# Patient Record
Sex: Female | Born: 1990 | Race: White | Hispanic: No | Marital: Single | Smoking: Never smoker
Health system: Southern US, Community
[De-identification: ages and names within clinical notes are randomized; demographics above are authoritative.]

## PROBLEM LIST (undated history)

## (undated) DIAGNOSIS — D1771 Benign lipomatous neoplasm of kidney: Secondary | ICD-10-CM

## (undated) DIAGNOSIS — N281 Cyst of kidney, acquired: Secondary | ICD-10-CM

## (undated) DIAGNOSIS — K589 Irritable bowel syndrome without diarrhea: Secondary | ICD-10-CM

## (undated) DIAGNOSIS — K859 Acute pancreatitis without necrosis or infection, unspecified: Secondary | ICD-10-CM

## (undated) DIAGNOSIS — F329 Major depressive disorder, single episode, unspecified: Secondary | ICD-10-CM

## (undated) DIAGNOSIS — K219 Gastro-esophageal reflux disease without esophagitis: Secondary | ICD-10-CM

## (undated) DIAGNOSIS — F419 Anxiety disorder, unspecified: Secondary | ICD-10-CM

## (undated) DIAGNOSIS — F32A Depression, unspecified: Secondary | ICD-10-CM

## (undated) DIAGNOSIS — F429 Obsessive-compulsive disorder, unspecified: Secondary | ICD-10-CM

## (undated) DIAGNOSIS — D649 Anemia, unspecified: Secondary | ICD-10-CM

## (undated) HISTORY — PX: CLOSED REDUCTION ELBOW DISLOCATION: SUR215

## (undated) HISTORY — DX: Obsessive-compulsive disorder, unspecified: F42.9

## (undated) HISTORY — PX: WISDOM TOOTH EXTRACTION: SHX21

## (undated) HISTORY — DX: Irritable bowel syndrome without diarrhea: K58.9

## (undated) HISTORY — DX: Cyst of kidney, acquired: N28.1

## (undated) HISTORY — DX: Benign lipomatous neoplasm of kidney: D17.71

## (undated) HISTORY — DX: Acute pancreatitis without necrosis or infection, unspecified: K85.90

## (undated) HISTORY — DX: Anxiety disorder, unspecified: F41.9

## (undated) HISTORY — PX: NASAL SEPTUM SURGERY: SHX37

---

## 2012-01-16 DIAGNOSIS — K859 Acute pancreatitis without necrosis or infection, unspecified: Secondary | ICD-10-CM

## 2012-01-16 HISTORY — DX: Acute pancreatitis without necrosis or infection, unspecified: K85.90

## 2012-02-08 ENCOUNTER — Inpatient Hospital Stay (HOSPITAL_COMMUNITY)
Admission: EM | Admit: 2012-02-08 | Discharge: 2012-02-12 | DRG: 204 | Disposition: A | Discharge status: Home / Self Care | Payer: BC Managed Care – PPO | Attending: Internal Medicine | Admitting: Internal Medicine

## 2012-02-08 ENCOUNTER — Emergency Department (HOSPITAL_COMMUNITY): Payer: BC Managed Care – PPO

## 2012-02-08 DIAGNOSIS — Z79899 Other long term (current) drug therapy: Secondary | ICD-10-CM

## 2012-02-08 DIAGNOSIS — K859 Acute pancreatitis without necrosis or infection, unspecified: Principal | ICD-10-CM | POA: Diagnosis present

## 2012-02-08 DIAGNOSIS — D3000 Benign neoplasm of unspecified kidney: Secondary | ICD-10-CM | POA: Diagnosis present

## 2012-02-08 DIAGNOSIS — F329 Major depressive disorder, single episode, unspecified: Secondary | ICD-10-CM | POA: Diagnosis present

## 2012-02-08 DIAGNOSIS — F3289 Other specified depressive episodes: Secondary | ICD-10-CM | POA: Diagnosis present

## 2012-02-08 DIAGNOSIS — R51 Headache: Secondary | ICD-10-CM | POA: Diagnosis present

## 2012-02-08 DIAGNOSIS — R748 Abnormal levels of other serum enzymes: Secondary | ICD-10-CM | POA: Diagnosis present

## 2012-02-08 DIAGNOSIS — D696 Thrombocytopenia, unspecified: Secondary | ICD-10-CM | POA: Diagnosis present

## 2012-02-08 DIAGNOSIS — K219 Gastro-esophageal reflux disease without esophagitis: Secondary | ICD-10-CM

## 2012-02-08 DIAGNOSIS — Q618 Other cystic kidney diseases: Secondary | ICD-10-CM

## 2012-02-08 DIAGNOSIS — K5900 Constipation, unspecified: Secondary | ICD-10-CM | POA: Diagnosis present

## 2012-02-08 DIAGNOSIS — R112 Nausea with vomiting, unspecified: Secondary | ICD-10-CM | POA: Diagnosis present

## 2012-02-08 DIAGNOSIS — R1013 Epigastric pain: Secondary | ICD-10-CM | POA: Diagnosis present

## 2012-02-08 HISTORY — DX: Major depressive disorder, single episode, unspecified: F32.9

## 2012-02-08 HISTORY — DX: Depression, unspecified: F32.A

## 2012-02-08 LAB — BASIC METABOLIC PANEL
BUN: 15 mg/dL (ref 6–23)
Calcium: 9.1 mg/dL (ref 8.4–10.5)
GFR calc Af Amer: >90 mL/min (ref >90)
GFR calc non Af Amer: >90 mL/min (ref >90)
Potassium: 4.2 mEq/L (ref 3.5–5.1)
Sodium: 137 mEq/L (ref 135–145)

## 2012-02-08 LAB — URINALYSIS, ROUTINE W REFLEX MICROSCOPIC
Bilirubin Urine: NEGATIVE
Hgb urine dipstick: NEGATIVE
Ketones, ur: NEGATIVE mg/dL
Nitrite: NEGATIVE
Specific Gravity, Urine: 1.006 (ref 1.005–1.030)
pH: 7.5 (ref 5.0–8.0)

## 2012-02-08 LAB — POCT I-STAT TROPONIN I: Troponin i, poc: 0 ng/mL (ref 0.00–0.08)

## 2012-02-08 LAB — CBC
MCH: 28 pg (ref 26.0–34.0)
MCHC: 34.2 g/dL (ref 30.0–36.0)
RDW: 12.3 % (ref 11.5–15.5)

## 2012-02-08 MED ORDER — SODIUM CHLORIDE 0.9 % IV SOLN
1000.0000 mL | INTRAVENOUS | Status: DC
  Administered 2012-02-08: 1000 mL via INTRAVENOUS

## 2012-02-08 MED ORDER — SODIUM CHLORIDE 0.9 % IV SOLN
1000.0000 mL | Freq: Once | INTRAVENOUS | Status: AC
  Administered 2012-02-08: 1000 mL via INTRAVENOUS

## 2012-02-08 MED ORDER — ONDANSETRON HCL 4 MG/2ML IJ SOLN
4.0000 mg | Freq: Four times a day (QID) | INTRAMUSCULAR | Status: DC | PRN
  Filled 2012-02-08 (×2): qty 2

## 2012-02-08 NOTE — ED Notes (Signed)
EKG printed and given to [facility] for review. No previous available for comparison

## 2012-02-08 NOTE — ED Notes (Signed)
RUE:[id number]<JW> Expected date:<BR> Expected time:<BR> Means of arrival:<BR> Comments:<BR> Rm 12, PT 32, 21 F, Epigastric Pain

## 2012-02-08 NOTE — ED Notes (Signed)
Per PTAR ,pt.is from home with complaint  Of abdominal pain with nausea and claimed that it hurts so bad that it hurts her chest too.A/O x 3. Denies SOB.

## 2012-02-08 NOTE — ED Provider Notes (Signed)
History   CSN: [id number] Arrival date & time 02/08/12  2036 First MD Initiated Contact with Patient 02/08/12 2102      Chief Complaint  Patient presents with  . Chest Pain  . Abdominal Pain  . Nausea    HPI Comments: The pain was severe in the epigastric area and into the chest.  It radiated to the back.  She had been having some milder issues recently. She thought she was having some trouble with acid reflux. Symptoms have all completely resolved at this point she feels fine   Patient is a 21 y.o. female presenting with abdominal pain. The history is provided by the patient.  Abdominal Pain The primary symptoms of the illness include abdominal pain, nausea and vomiting. The primary symptoms of the illness do not include fever or fatigue. Episode onset: The symptoms started 90 minutes ago. The onset of the illness was sudden (The patient had eaten at a cookout and had a corn dog and milk shake prior to the episode).  The pain came on suddenly. The abdominal pain is located in the epigastric region. The abdominal pain radiates to the chest. The abdominal pain is relieved by nothing.    No past medical history on file.  No past surgical history on file.  No family history on file.  History  Substance Use Topics  . Smoking status: Not on file  . Smokeless tobacco: Not on file  . Alcohol Use: Not on file    OB History    No data available      Review of Systems  Constitutional: Negative for fever and fatigue.  Respiratory: Negative for cough.   Cardiovascular: Positive for chest pain.  Gastrointestinal: Positive for nausea, vomiting and abdominal pain.  All other systems reviewed and are negative.    Allergies  Penicillins  Home Medications   Current Outpatient Rx  Name Route Sig Dispense Refill  . CALCIUM CARBONATE ANTACID 500 MG PO CHEW Oral Chew 2 tablets by mouth daily as needed. heatburn    . FLUOXETINE HCL 90 MG PO CPDR Oral Take 90 mg by mouth every 7  (seven) days.    . CENTRUM PO Oral Take 1 tablet by mouth daily.    Marland Kitchen OMEPRAZOLE 20 MG PO CPDR Oral Take 40 mg by mouth daily.    . PSYLLIUM 95 % PO PACK Oral Take 1 packet by mouth daily.      BP 118/87  Pulse 105  Temp 97.9 F (36.6 C) (Oral)  Resp 15  SpO2 100%  Physical Exam  Nursing note and vitals reviewed. Constitutional: She appears well-developed and well-nourished. No distress.  HENT:  Head: Normocephalic and atraumatic.  Right Ear: External ear normal.  Left Ear: External ear normal.  Eyes: Conjunctivae are normal. Right eye exhibits no discharge. Left eye exhibits no discharge. No scleral icterus.  Neck: Neck supple. No tracheal deviation present.  Cardiovascular: Normal rate, regular rhythm and intact distal pulses.   Pulmonary/Chest: Effort normal and breath sounds normal. No stridor. No respiratory distress. She has no wheezes. She has no rales.  Abdominal: Soft. Bowel sounds are normal. She exhibits no distension. There is tenderness in the right upper quadrant and epigastric area. There is no rebound and no guarding.       Mild ttp  Musculoskeletal: She exhibits no edema and no tenderness.  Neurological: She is alert. She has normal strength. No sensory deficit. Cranial nerve deficit:  no gross defecits noted. She exhibits normal muscle  tone. She displays no seizure activity. Coordination normal.  Skin: Skin is warm and dry. No rash noted.  Psychiatric: She has a normal mood and affect.    ED Course  Procedures (including critical care time)  Rate: 87  Rhythm: normal sinus rhythm  QRS Axis: normal  Intervals: normal  ST/T Wave abnormalities: normal  Conduction Disutrbances:none  Narrative Interpretation:   Old EKG Reviewed: none available  Medications  FLUoxetine (PROZAC WEEKLY) 90 MG DR capsule (not administered)  Multiple Vitamins-Minerals (CENTRUM PO) (not administered)  omeprazole (PRILOSEC) 20 MG capsule (not administered)  calcium carbonate (TUMS  - DOSED IN MG ELEMENTAL CALCIUM) 500 MG chewable tablet (not administered)  psyllium (HYDROCIL/METAMUCIL) 95 % PACK (not administered)  ondansetron (ZOFRAN) injection 4 mg (not administered)  0.9 %  sodium chloride infusion (1000 mL Intravenous New Bag/Given 02/08/12 2200)    Followed by  0.9 %  sodium chloride infusion (1000 mL Intravenous New Bag/Given 02/08/12 2322)    Labs Reviewed  CBC - Abnormal; Notable for the following:    Platelets 133 (*)     All other components within normal limits  BASIC METABOLIC PANEL - Abnormal; Notable for the following:    Glucose, Bld 104 (*)     All other components within normal limits  LIPASE, BLOOD - Abnormal; Notable for the following:    Lipase >3000 (*)  REPEATED TO VERIFY   All other components within normal limits  POCT I-STAT TROPONIN I  URINALYSIS, ROUTINE W REFLEX MICROSCOPIC  PREGNANCY, URINE  HEPATIC FUNCTION PANEL   US Abdomen Complete  02/08/2012  *RADIOLOGY REPORT*  Clinical Data:  Epigastric abdominal pain  COMPLETE ABDOMINAL ULTRASOUND  Comparison:  None.  Findings:  Gallbladder:  No gallstones, gallbladder wall thickening, or pericholecystic fluid.  Common bile duct:  Measures 3 mm, within normal limits.  Liver:  Echogenicity within normal limits.  No focal abnormality.  IVC:  Appears normal.  Pancreas:  No focal abnormality identified within the head/neck. The body/tail are obscured by overlying bowel gas artifact.  Spleen:  Measures 7 cm, within normal limits.  Right Kidney:  Measures 11.4 cm.  There is a subcentimeter hypoechoic/nearly anechoic focus along the interpolar right kidney, favored to reflect a cyst by incomplete characterize.  Left Kidney:  Measures 11 cm. Mild pelvicaliectasis.  8 mm hyperechoic focus lower pole.  Abdominal aorta:  No aneurysm identified.  IMPRESSION: Mild pelvicaliectasis on the left.  Correlate clinically if concerned for distal obstruction or stone.  Subcentimeter renal lesions, hypoechoic on the right  and hyperechoic on the left, incompletely characterized.  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  Can be further characterized with a renal MRI follow-up.   Original Report Authenticated By: Waneta Martins, M.D.      1. Pancreatitis       MDM  Pt admit to daily alcohol use but states she only has one beverage per night.  No sign of gallstones on ultrasound.  Pt is feeling better now but her pain was very severe.  Will admit for pain management, bowel rest.        Celene Kras, MD 02/09/12 [id number]

## 2012-02-09 ENCOUNTER — Encounter (HOSPITAL_COMMUNITY): Payer: Self-pay | Admitting: Internal Medicine

## 2012-02-09 DIAGNOSIS — D696 Thrombocytopenia, unspecified: Secondary | ICD-10-CM

## 2012-02-09 DIAGNOSIS — R112 Nausea with vomiting, unspecified: Secondary | ICD-10-CM | POA: Diagnosis present

## 2012-02-09 DIAGNOSIS — R1013 Epigastric pain: Secondary | ICD-10-CM

## 2012-02-09 DIAGNOSIS — K859 Acute pancreatitis without necrosis or infection, unspecified: Principal | ICD-10-CM | POA: Diagnosis present

## 2012-02-09 LAB — BASIC METABOLIC PANEL
BUN: 7 mg/dL (ref 6–23)
Chloride: 104 mEq/L (ref 96–112)
Creatinine, Ser: 0.45 mg/dL — ABNORMAL LOW (ref 0.50–1.10)
GFR calc Af Amer: >90 mL/min (ref >90)
GFR calc non Af Amer: >90 mL/min (ref >90)

## 2012-02-09 LAB — HEPATIC FUNCTION PANEL
ALT: 13 U/L (ref 0–35)
Albumin: 3.9 g/dL (ref 3.5–5.2)
Alkaline Phosphatase: 48 U/L (ref 39–117)
Total Bilirubin: 0.1 mg/dL — ABNORMAL LOW (ref 0.3–1.2)
Total Protein: 7.1 g/dL (ref 6.0–8.3)

## 2012-02-09 LAB — CBC
HCT: 34.7 % — ABNORMAL LOW (ref 36.0–46.0)
MCHC: 35.2 g/dL (ref 30.0–36.0)
RDW: 12.4 % (ref 11.5–15.5)

## 2012-02-09 MED ORDER — ACETAMINOPHEN 650 MG RE SUPP: 650.0000 mg | Freq: Four times a day (QID) | RECTAL | Status: DC | PRN

## 2012-02-09 MED ORDER — DEXTROSE-NACL 5-0.45 % IV SOLN
INTRAVENOUS | Status: AC
  Administered 2012-02-09: 04:00:00 via INTRAVENOUS

## 2012-02-09 MED ORDER — ENOXAPARIN SODIUM 40 MG/0.4ML ~~LOC~~ SOLN
40.0000 mg | SUBCUTANEOUS | Status: DC
  Administered 2012-02-09 – 2012-02-12 (×4): 40 mg via SUBCUTANEOUS
  Filled 2012-02-09 (×5): qty 0.4

## 2012-02-09 MED ORDER — ONDANSETRON HCL 4 MG PO TABS: 4.0000 mg | ORAL_TABLET | Freq: Four times a day (QID) | ORAL | Status: DC | PRN

## 2012-02-09 MED ORDER — PANTOPRAZOLE SODIUM 40 MG IV SOLR
40.0000 mg | Freq: Two times a day (BID) | INTRAVENOUS | Status: DC
  Administered 2012-02-09 – 2012-02-12 (×8): 40 mg via INTRAVENOUS
  Filled 2012-02-09 (×9): qty 40

## 2012-02-09 MED ORDER — ZOLPIDEM TARTRATE 5 MG PO TABS: 5.0000 mg | ORAL_TABLET | Freq: Every evening | ORAL | Status: DC | PRN

## 2012-02-09 MED ORDER — SODIUM CHLORIDE 0.9 % IV SOLN
INTRAVENOUS | Status: DC
  Administered 2012-02-09: 20:00:00 via INTRAVENOUS
  Administered 2012-02-09: 125 mL/h via INTRAVENOUS
  Administered 2012-02-10: 04:00:00 via INTRAVENOUS

## 2012-02-09 MED ORDER — FLUOXETINE HCL 90 MG PO CPDR
90.0000 mg | DELAYED_RELEASE_CAPSULE | ORAL | Status: DC
  Administered 2012-02-09: 90 mg via ORAL
  Filled 2012-02-09: qty 1

## 2012-02-09 MED ORDER — ONDANSETRON HCL 4 MG/2ML IJ SOLN
4.0000 mg | Freq: Four times a day (QID) | INTRAMUSCULAR | Status: DC | PRN
  Administered 2012-02-09 – 2012-02-10 (×2): 4 mg via INTRAVENOUS

## 2012-02-09 MED ORDER — ONDANSETRON HCL 4 MG/2ML IJ SOLN: 4.0000 mg | Freq: Three times a day (TID) | INTRAMUSCULAR | Status: AC | PRN

## 2012-02-09 MED ORDER — OXYCODONE HCL 5 MG PO TABS
5.0000 mg | ORAL_TABLET | ORAL | Status: DC | PRN
  Administered 2012-02-09 – 2012-02-10 (×2): 5 mg via ORAL
  Filled 2012-02-09 (×2): qty 1

## 2012-02-09 MED ORDER — ACETAMINOPHEN 325 MG PO TABS: 650.0000 mg | ORAL_TABLET | Freq: Four times a day (QID) | ORAL | Status: DC | PRN

## 2012-02-09 MED ORDER — HYDROMORPHONE HCL PF 1 MG/ML IJ SOLN
0.5000 mg | INTRAMUSCULAR | Status: DC | PRN
  Administered 2012-02-09 – 2012-02-10 (×2): 1 mg via INTRAVENOUS
  Filled 2012-02-09 (×2): qty 1

## 2012-02-09 MED ORDER — HYDROMORPHONE HCL PF 1 MG/ML IJ SOLN: 1.0000 mg | INTRAMUSCULAR | Status: AC | PRN

## 2012-02-09 MED ORDER — ALUM & MAG HYDROXIDE-SIMETH 200-200-20 MG/5ML PO SUSP: 30.0000 mL | Freq: Four times a day (QID) | ORAL | Status: DC | PRN

## 2012-02-09 NOTE — H&P (Signed)
[facility] History and Physical  Darrelle Barrell EAV:[id number] DOB: 01-Sep-1990 DOA: 02/08/2012  Referring physician: EDP PCP: No primary provider on file.   Chief Complaint: Severe Epigastric Pain  HPI: Katherine Manning is a 21 y.o. female presents to the ED with complaints of severe epigastric ABD Pain which has been occuring intermittently for the past year, but worsened today over the past 12 hours.  She reports that the pain became so severe that it caused her to double over.  She describes the pain as being sharp and was continuous today for 20 minutes and was a 10/10.  She had nausea and vomiting X 1 today as well.  She denies hemoptysis, and diarrhea, and also denies fevers and chills.  She does report feeling increased indigestion symptoms.    Review of Systems: The patient denies anorexia, fever, weight loss, vision loss, decreased hearing, hoarseness, syncope, dyspnea on exertion, peripheral edema, balance deficits, hemoptysis, melena, hematochezia, hematuria, incontinence, genital sores, muscle weakness, suspicious skin lesions, transient blindness, difficulty walking, unusual weight change, abnormal bleeding, enlarged lymph nodes, angioedema, and breast masses.    Past Medical History:  Depression  Past Surgical History:  Nasal Septoplasty, Right Elbow Surgery Due to Dislocation, Wisdom Teeth.     Prior to Admission medications   Medication Sig Start Date End Date Taking? Authorizing Provider  calcium carbonate (TUMS - DOSED IN MG ELEMENTAL CALCIUM) 500 MG chewable tablet Chew 2 tablets by mouth daily as needed. heatburn   Yes Historical Provider, MD  FLUoxetine (PROZAC WEEKLY) 90 MG DR capsule Take 90 mg by mouth every 7 (seven) days.   Yes Historical Provider, MD  Multiple Vitamins-Minerals (CENTRUM PO) Take 1 tablet by mouth daily.   Yes Historical Provider, MD  omeprazole (PRILOSEC) 20 MG capsule Take 40 mg by mouth daily.   Yes Historical Provider, MD  psyllium  (HYDROCIL/METAMUCIL) 95 % PACK Take 1 packet by mouth daily.   Yes Historical Provider, MD    Allergies  Allergen Reactions  . Penicillins Hives     Social History:   Second Engineer, maintenance (IT).    does not have a smoking history on file. She does not have any smokeless tobacco history on file. Her alcohol and drug histories not on file.  She reports drinking 1 drink daily (Beer or wine) for a few years.     Physical Exam:  GEN:  Pleasant Thin well developed Caucasian female examined  and in  Discomfort but no acute distress; cooperative with exam Filed Vitals:   02/08/12 2034 02/08/12 2037  BP:  118/87  Pulse:  105  Temp:  97.9 F (36.6 C)  TempSrc:  Oral  Resp:  15  SpO2: 98% 100%    Blood pressure 118/87, pulse 105, temperature 97.9 F (36.6 C), temperature source Oral, resp. rate 15, SpO2 100.00%. PSYCH: She is alert and oriented x4; does not appear anxious does not appear depressed; affect is normal HEENT: Normocephalic and Atraumatic, Mucous membranes pink; PERRLA; EOM intact; Fundi:  Benign;  No scleral icterus, Nares: Patent, Oropharynx: Clear, Fair Dentition, Neck:  FROM, no cervical lymphadenopathy nor thyromegaly or carotid bruit; no JVD; Breasts:: Not examined CHEST WALL: No tenderness CHEST: Normal respiration, clear to auscultation bilaterally HEART: Regular rate and rhythm; no murmurs rubs or gallops BACK: No kyphosis or scoliosis; no CVA tenderness ABDOMEN: Positive Bowel Sounds, Scaphoid, soft tender in the Epigastrium No Rebound No Guarding; no masses, no organomegaly.   Rectal Exam: Not done EXTREMITIES: No bone or joint  deformity; age-appropriate arthropathy of the hands and knees; no cyanosis, clubbing or edema; no ulcerations. Genitalia: not examined PULSES: 2+ and symmetric SKIN: Normal hydration no rash or ulceration CNS: Cranial nerves 2-12 grossly intact no focal neurologic deficit   Labs on Admission:  Basic Metabolic Panel:  Lab 02/08/12 1610   NA 137  K 4.2  CL 102  CO2 25  GLUCOSE 104*  BUN 15  CREATININE 0.50  CALCIUM 9.1  MG --  PHOS --   Liver Function Tests:  Lab 02/08/12 2125  AST 18  ALT 13  ALKPHOS 48  BILITOT 0.1*  PROT 7.1  ALBUMIN 3.9    Lab 02/08/12 2125  LIPASE >3000*  AMYLASE --   No results found for this basename: AMMONIA:5 in the last 168 hours CBC:  Lab 02/08/12 2125  WBC 10.3  NEUTROABS --  HGB 13.6  HCT 39.8  MCV 81.9  PLT 133*   Cardiac Enzymes: No results found for this basename: CKTOTAL:5,CKMB:5,CKMBINDEX:5,TROPONINI:5 in the last 168 hours  BNP (last 3 results) No results found for this basename: PROBNP:3 in the last 8760 hours CBG: No results found for this basename: GLUCAP:5 in the last 168 hours  Radiological Exams on Admission: US Abdomen Complete  02/08/2012  *RADIOLOGY REPORT*  Clinical Data:  Epigastric abdominal pain  COMPLETE ABDOMINAL ULTRASOUND  Comparison:  None.  Findings:  Gallbladder:  No gallstones, gallbladder wall thickening, or pericholecystic fluid.  Common bile duct:  Measures 3 mm, within normal limits.  Liver:  Echogenicity within normal limits.  No focal abnormality.  IVC:  Appears normal.  Pancreas:  No focal abnormality identified within the head/neck. The body/tail are obscured by overlying bowel gas artifact.  Spleen:  Measures 7 cm, within normal limits.  Right Kidney:  Measures 11.4 cm.  There is a subcentimeter hypoechoic/nearly anechoic focus along the interpolar right kidney, favored to reflect a cyst by incomplete characterize.  Left Kidney:  Measures 11 cm. Mild pelvicaliectasis.  8 mm hyperechoic focus lower pole.  Abdominal aorta:  No aneurysm identified.  IMPRESSION: Mild pelvicaliectasis on the left.  Correlate clinically if concerned for distal obstruction or stone.  Subcentimeter renal lesions, hypoechoic on the right and hyperechoic on the left, incompletely characterized.  Favored to reflect a benign mildly complex cyst and an AML  respectively.  Can be further characterized with a renal MRI follow-up.   Original Report Authenticated By: Waneta Martins, M.D.     EKG: Independently reviewed.   Assessment: Principal Problem:  *Acute pancreatitis Active Problems:  Epigastric abdominal pain  Nausea & vomiting  Thrombocytopenia   Plan:    Admit to Med/Surg Bed Supportive Care with IVFs, Clear liquid Diet Bowel Rest.   Anti-Emetics PRN, and Pain Control PRN.   Monitor Lipase levels, and LFTs, and Platelets DVT Prophylaxis Consider GI Consult     Code Status: FULL CODE Family Communication:   Disposition Plan: Return to Home  Time spent: 60 minutes

## 2012-02-09 NOTE — Progress Notes (Signed)
1:45 PM I agree with HPI/GPe and A/P per Dr. Lovell Sheehan   Patient admitted early this morning with severe pain, lipase is over 200, patient's ultrasound was negative. She states she is having reflux like symptoms over the past 2 months and was given a PPI to take by student health.  She states she has no current pain is not been requesting pain medication.  She has been on clear liquids.  She states that the pain was in the pit of her stomach with radiation to the back and was a boring-type pain.  She states she also has some moderate dysphagia and discomfort with being n.p.o. for any periods of time.  This raises the question that this might be #1 gallstone pancreatitis vs. #2 his severe esophagitis.  I think that patient potentially looks much better than her numbers and will need close outpatient gastroenterology followup.  We will keep her on clear liquids, gradually diet probably the morning, get a lipase tomorrow, have patient ambulate, continue IV fluids, and I will discuss with family the rest of her course of care.  I did call her father at telephone #[phone] and updated him as to the course of care      Patient Active Problem List  Diagnosis  . Acute pancreatitis  . Epigastric abdominal pain  . Nausea & vomiting  . Thrombocytopenia   Pleas Koch, MD [facility] [phone]

## 2012-02-10 ENCOUNTER — Inpatient Hospital Stay (HOSPITAL_COMMUNITY): Payer: BC Managed Care – PPO

## 2012-02-10 LAB — COMPREHENSIVE METABOLIC PANEL
AST: 13 U/L (ref 0–37)
BUN: 3 mg/dL — ABNORMAL LOW (ref 6–23)
CO2: 26 mEq/L (ref 19–32)
Calcium: 8.5 mg/dL (ref 8.4–10.5)
Creatinine, Ser: 0.49 mg/dL — ABNORMAL LOW (ref 0.50–1.10)
GFR calc Af Amer: >90 mL/min (ref >90)
GFR calc non Af Amer: >90 mL/min (ref >90)
Glucose, Bld: 88 mg/dL (ref 70–99)

## 2012-02-10 LAB — LIPASE, BLOOD: Lipase: 30 U/L (ref 11–59)

## 2012-02-10 MED ORDER — SODIUM CHLORIDE 0.9 % IV SOLN
1000.0000 mL | INTRAVENOUS | Status: DC
  Administered 2012-02-10 – 2012-02-11 (×2): 1000 mL via INTRAVENOUS

## 2012-02-10 MED ORDER — GADOBENATE DIMEGLUMINE 529 MG/ML IV SOLN
11.0000 mL | Freq: Once | INTRAVENOUS | Status: AC | PRN
  Administered 2012-02-10: 11 mL via INTRAVENOUS

## 2012-02-10 MED ORDER — ACETAMINOPHEN 10 MG/ML IV SOLN
1000.0000 mg | Freq: Four times a day (QID) | INTRAVENOUS | Status: AC
  Administered 2012-02-10 – 2012-02-11 (×4): 1000 mg via INTRAVENOUS
  Filled 2012-02-10 (×4): qty 100

## 2012-02-10 NOTE — Consult Note (Signed)
Referring Provider: Hospitalist Primary Care Physician:  [facility] Primary Gastroenterologist:  None  Reason for Consultation:  Abdominal pain.  Pancreatitis.  HPI: Katherine Manning is a 21 y.o. female who had been experiencing constant epigastric pain for the last couple of months.  Says that it would worsen with eating.  First noticed after she had started going to the [place] bar with some friends; she stopped going, but the pain continued.  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  Had taken some Tums as well.  Saturday she developed sudden onset of worsening pain with nausea and vomiting.  Pain was radiating through to her back and was an 8-9/10.  Last about 20 minutes and by the time EMS came the pain had started to subside.  On admission her lipase was over 3000.  Ultrasound did not show any gallstones, dilated bile ducts, or pancreatic issues (from what could be visualized).  LFT's were WNL's.  Lipase has returned to normal at this point and patient is feeling better.  Still with some soreness in upper abdomen.  Tried eating clears last night and got sick from those.  She says that she drinks about one drink a night; had been at a party on Friday night, but did not drink excessively.  Admits to chronic constipation for which she has tried taking intermittent doses of metamucil and miralax.   Past Medical History  Diagnosis Date  . Depression     Past Surgical History  Procedure Date  . Nasal septum surgery   . Closed reduction elbow dislocation   . Wisdom tooth extraction     Prior to Admission medications   Medication Sig Start Date End Date Taking? Authorizing Provider  calcium carbonate (TUMS - DOSED IN MG ELEMENTAL CALCIUM) 500 MG chewable tablet Chew 2 tablets by mouth daily as needed. heatburn   Yes Historical Provider, MD  FLUoxetine (PROZAC WEEKLY) 90 MG DR capsule Take 90 mg by mouth every 7 (seven) days.   Yes Historical  Provider, MD  Multiple Vitamins-Minerals (CENTRUM PO) Take 1 tablet by mouth daily.   Yes Historical Provider, MD  omeprazole (PRILOSEC) 20 MG capsule Take 40 mg by mouth daily.   Yes Historical Provider, MD  psyllium (HYDROCIL/METAMUCIL) 95 % PACK Take 1 packet by mouth daily.   Yes Historical Provider, MD    Current Facility-Administered Medications  Medication Dose Route Frequency Provider Last Rate Last Dose  . 0.9 %  sodium chloride infusion  1,000 mL Intravenous Continuous Celene Kras, MD 125 mL/hr at 02/08/12 2322 1,000 mL at 02/08/12 2322  . 0.9 %  sodium chloride infusion   Intravenous Continuous Ron Parker, MD 125 mL/hr at 02/10/12 0428    . acetaminophen (TYLENOL) tablet 650 mg  650 mg Oral Q6H PRN Ron Parker, MD       Or  . acetaminophen (TYLENOL) suppository 650 mg  650 mg Rectal Q6H PRN Ron Parker, MD      . alum & mag hydroxide-simeth (MAALOX/MYLANTA) 200-200-20 MG/5ML suspension 30 mL  30 mL Oral Q6H PRN Ron Parker, MD      . dextrose 5 %-0.45 % sodium chloride infusion   Intravenous STAT Celene Kras, MD 100 mL/hr at 02/09/12 0423    . enoxaparin (LOVENOX) injection 40 mg  40 mg Subcutaneous Q24H Ron Parker, MD   40 mg at 02/09/12 1057  . FLUoxetine (PROZAC WEEKLY) DR capsule 90 mg  90  mg Oral Weekly Ron Parker, MD   90 mg at 02/09/12 1720  . HYDROmorphone (DILAUDID) injection 0.5-1 mg  0.5-1 mg Intravenous Q3H PRN Ron Parker, MD   1 mg at 02/10/12 0204  . HYDROmorphone (DILAUDID) injection 1 mg  1 mg Intravenous Q4H PRN Celene Kras, MD      . ondansetron [facility]) injection 4 mg  4 mg Intravenous Q6H PRN Celene Kras, MD      . ondansetron [facility]) injection 4 mg  4 mg Intravenous Q8H PRN Celene Kras, MD      . ondansetron [facility]) tablet 4 mg  4 mg Oral Q6H PRN Ron Parker, MD       Or  . ondansetron (ZOFRAN) injection 4 mg  4 mg Intravenous Q6H PRN Ron Parker, MD   4 mg at 02/10/12 0204  . oxyCODONE (Oxy  IR/ROXICODONE) immediate release tablet 5 mg  5 mg Oral Q4H PRN Ron Parker, MD   5 mg at 02/09/12 1904  . pantoprazole (PROTONIX) injection 40 mg  40 mg Intravenous Q12H Ron Parker, MD   40 mg at 02/09/12 2200  . zolpidem (AMBIEN) tablet 5 mg  5 mg Oral QHS PRN Ron Parker, MD        Allergies as of 02/08/2012 - Review Complete 02/08/2012  Allergen Reaction Noted  . Penicillins Hives 02/08/2012    Family History  Problem Relation Age of Onset  . Coronary artery disease Paternal Grandfather   . Hypertension Maternal Grandmother   . Breast cancer Paternal Grandmother     History   Social History  . Marital Status: Single                 Occupational History  . College student [employer]   Social History Main Topics  . Smoking status: Never Smoker   . Smokeless tobacco: Not on file  . Alcohol Use: 0.0 oz/week  . Drug Use: No  . Sexually Active: Not Currently      Review of Systems: Ten point ROS O/W negative except as mentioned in HPI.  Physical Exam: Vital signs in last 24 hours: Temp:  [97.9 F (36.6 C)-98.8 F (37.1 C)] 97.9 F (36.6 C) (08/26 0140) Pulse Rate:  [78-110] 110  (08/26 0140) Resp:  [16-18] 18  (08/26 0140) BP: (112-127)/(65-69) 119/67 mmHg (08/26 0140) SpO2:  [99 %-100 %] 100 % (08/26 0140) Last BM Date: 02/09/12 General:   Alert,  Well-developed, well-nourished, pleasant and cooperative in NAD Head:  Normocephalic and atraumatic. Eyes:  Sclera clear, no icterus.  Conjunctiva pink. Ears:  Normal auditory acuity. Mouth:  No deformity or lesions.   Lungs:  Clear throughout to auscultation.   No wheezes, crackles, or rhonchi. Heart:  Regular rate and rhythm; no murmurs, clicks, rubs,  or gallops. Abdomen:  Soft, mild diffuse TTP > upper abdomen, BS active, nonpalp mass or hsm.   Msk:  Symmetrical without gross deformities. . Pulses:  Normal pulses noted. Extremities:  Without clubbing or edema. Neurologic:  Alert and  oriented  x4;  grossly normal neurologically. Skin:  Intact without significant lesions or rashes.. Psych:  Alert and cooperative. Normal mood and affect.  Intake/Output from previous day: 08/25 0701 - 08/26 0700 In: 2120 [P.O.:2120] Out: 6150 [Urine:6150]  Lab Results:  Lipase initially >3000, now down to 30  Basename 02/09/12 0517 02/08/12 2125  WBC 7.8 10.3  HGB 12.2 13.6  HCT 34.7* 39.8  PLT 112* 133*  BMET  Basename 02/10/12 0850 02/09/12 0517 02/08/12 2125  NA 135 135 137  K 3.6 3.5 4.2  CL 102 104 102  CO2 26 23 25   GLUCOSE 88 112* 104*  BUN 3* 7 15  CREATININE 0.49* 0.45* 0.50  CALCIUM 8.5 8.2* 9.1   LFT  Basename 02/10/12 0850 02/08/12 2125  PROT 5.8* --  ALBUMIN 3.3* --  AST 13 --  ALT 11 --  ALKPHOS 40 --  BILITOT 0.3 --  BILIDIR -- <0.1  IBILI -- NOT CALCULATED   Studies/Results: US Abdomen Complete  02/08/2012  *RADIOLOGY REPORT*  Clinical Data:  Epigastric abdominal pain  COMPLETE ABDOMINAL ULTRASOUND  Comparison:  None.  Findings:  Gallbladder:  No gallstones, gallbladder wall thickening, or pericholecystic fluid.  Common bile duct:  Measures 3 mm, within normal limits.  Liver:  Echogenicity within normal limits.  No focal abnormality.  IVC:  Appears normal.  Pancreas:  No focal abnormality identified within the head/neck. The body/tail are obscured by overlying bowel gas artifact.  Spleen:  Measures 7 cm, within normal limits.  Right Kidney:  Measures 11.4 cm.  There is a subcentimeter hypoechoic/nearly anechoic focus along the interpolar right kidney, favored to reflect a cyst by incomplete characterize.  Left Kidney:  Measures 11 cm. Mild pelvicaliectasis.  8 mm hyperechoic focus lower pole.  Abdominal aorta:  No aneurysm identified.  IMPRESSION: Mild pelvicaliectasis on the left.  Correlate clinically if concerned for distal obstruction or stone.  Subcentimeter renal lesions, hypoechoic on the right and hyperechoic on the left, incompletely characterized.  Favored  to reflect a benign mildly complex cyst and an AML respectively.  Can be further characterized with a renal MRI follow-up.   Original Report Authenticated By: Waneta Martins, M.D.     IMPRESSION:  1.  Acute pancreatitis-Lipase initially over 3000, but now normalized. 2.  Epigastric pain-intermittent over the last few months, but worsened with this acute episode.  ? Underlying GERD. 3.  Nausea and vomiting-secondary to #1 4.  Thrombocytopenia 5.  Constipation-chronic.  PLAN: 1.   ZEHR, JESSICA D.  02/10/2012, 10:10 AM  Pager number [phone]  [facility] Attending  I have also seen and assessed the patient and agree with the above note.  She is improved and appears to have had acute pancreatitis within 24 hours of drinking 5 shots of alcohol.  There is a background of more chronic daily dyspepsia.  Will get MR/MRCP and then decide if any further work-up needed.  If it is unrevealing anticipate PPI therapy, avoid alcohol and outpatient follow-up.  She could need an EGd but not convinced it is necessary.  Iva Boop, MD, [facility] [phone] (pager) 02/10/2012 1:36 PM

## 2012-02-10 NOTE — Progress Notes (Signed)
PROGRESS NOTE  Katherine Manning ZOX:[id number] DOB: [birth year] DOA: 02/08/2012 PCP: No primary provider on file.  Brief narrative: 21 yr old admitted 8/25 with Abd pain-? Esophagitis/ ?Gall stone pancreatitis-patient has been seen by GI 8/26 who recommends MRCP   Past medical history-As per Problem list Chart reviewed as below- See HPI and notes  Consultants:  Gi-Dr. Leone Payor  Procedures:  US abdomen 02/08/12-Mild pelvicaliectasis on the left. Correlate clinically if concerned for distal obstruction or stone. Subcentimeter renal lesions, hypoechoic on the right and hyperechoic on the left, incompletely characterized. 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 Can be further characterized with a renal MRI follow-up.  Antibiotics:  NAD   Subjective  Feels much better.  Hungry and has HA.  States drinks a lot of caffeine usually and feels like this is withdrawal from the same   Objective    Interim History: Appreciate Dr. Marvell Fuller input  Telemetry: Non-tele  Objective: Filed Vitals:   02/09/12 2035 02/10/12 0140 02/10/12 0935 02/10/12 1416  BP: 127/66 119/67 114/70 108/71  Pulse: 84 110 84 91  Temp: 98.2 F (36.8 C) 97.9 F (36.6 C) 98.2 F (36.8 C) 98.9 F (37.2 C)  TempSrc: Oral Oral Oral Oral  Resp: 18 18 18 18   Height:      Weight:      SpO2: 99% 100% 100% 100%    Intake/Output Summary (Last 24 hours) at 02/10/12 1521 Last data filed at 02/10/12 1416  Gross per 24 hour  Intake    320 ml  Output   4800 ml  Net  -4480 ml    Exam:  General: NAD Cardiovascular: s1 s2 no m/r/g Respiratory: clear Abdomen: mild epig tenderness Skin no rash  Neuro: Anxious.  Data Reviewed: Basic Metabolic Panel:  Lab 02/10/12 8119 02/09/12 0517 02/08/12 2125  NA 135 135 137  K 3.6 3.5 --  CL 102 104 102  CO2 26 23 25   GLUCOSE 88 112* 104*  BUN 3* 7 15  CREATININE 0.49* 0.45* 0.50  CALCIUM 8.5 8.2* 9.1  MG -- -- --  PHOS -- -- --     Liver Function Tests:  Lab 02/10/12 0850 02/08/12 2125  AST 13 18  ALT 11 13  ALKPHOS 40 48  BILITOT 0.3 0.1*  PROT 5.8* 7.1  ALBUMIN 3.3* 3.9    Lab 02/10/12 0850 02/09/12 0500 02/08/12 2125  LIPASE 30 236* >3000*  AMYLASE -- -- --   No results found for this basename: AMMONIA:5 in the last 168 hours CBC:  Lab 02/09/12 0517 02/08/12 2125  WBC 7.8 10.3  NEUTROABS -- --  HGB 12.2 13.6  HCT 34.7* 39.8  MCV 81.1 81.9  PLT 112* 133*   Cardiac Enzymes: No results found for this basename: CKTOTAL:5,CKMB:5,CKMBINDEX:5,TROPONINI:5 in the last 168 hours BNP: No components found with this basename: POCBNP:5 CBG: No results found for this basename: GLUCAP:5 in the last 168 hours  No results found for this or any previous visit (from the past 240 hour(s)).   Studies:              All Imaging reviewed and is as per above notation   Scheduled Meds:   . dextrose 5 % and 0.45% NaCl   Intravenous STAT  . enoxaparin (LOVENOX) injection  40 mg Subcutaneous Q24H  . FLUoxetine  90 mg Oral Weekly  . pantoprazole (PROTONIX) IV  40 mg Intravenous Q12H   Continuous Infusions:   . sodium chloride 1,000 mL (  02/08/12 2322)  . sodium chloride 125 mL/hr at 02/10/12 0428     Assessment/Plan: 1. Abd pain-Await MRCP for delineation of GI pain-she also has some anomalies on US abdomen which are potentially better delineated by MRCP.  Await the same and review clinically 2. Headache-likely 2/2 to caffeine withdrawal vs hypoglycemia-IV tylenol ordered 3. relative TCP--potentially dilutional 2/2 to IVF, decreased rate to 50 cc/hr 8/26-rpt CBC in am  Code Status: Full Family Communication: Patient willing to update father herself Disposition Plan: per MRCP results   Pleas Koch, MD  [facility] Pager [phone] 02/10/2012, 3:21 PM    LOS: 2 days

## 2012-02-11 ENCOUNTER — Encounter: Payer: Self-pay | Admitting: Internal Medicine

## 2012-02-11 DIAGNOSIS — K859 Acute pancreatitis without necrosis or infection, unspecified: Secondary | ICD-10-CM

## 2012-02-11 DIAGNOSIS — R1013 Epigastric pain: Secondary | ICD-10-CM

## 2012-02-11 LAB — CBC
Hemoglobin: 13.2 g/dL (ref 12.0–15.0)
MCH: 28.6 pg (ref 26.0–34.0)
MCHC: 35.1 g/dL (ref 30.0–36.0)

## 2012-02-11 LAB — COMPREHENSIVE METABOLIC PANEL
ALT: 10 U/L (ref 0–35)
AST: 14 U/L (ref 0–37)
CO2: 24 mEq/L (ref 19–32)
Calcium: 8.4 mg/dL (ref 8.4–10.5)
Chloride: 104 mEq/L (ref 96–112)
GFR calc non Af Amer: >90 mL/min (ref >90)
Potassium: 3.3 mEq/L — ABNORMAL LOW (ref 3.5–5.1)
Sodium: 136 mEq/L (ref 135–145)

## 2012-02-11 MED ORDER — PANTOPRAZOLE SODIUM 40 MG PO TBEC: 40.0000 mg | DELAYED_RELEASE_TABLET | Freq: Every day | ORAL | Status: DC

## 2012-02-11 MED ORDER — POLYETHYLENE GLYCOL 3350 17 G PO PACK
17.0000 g | PACK | Freq: Every day | ORAL | Status: DC
  Administered 2012-02-11 – 2012-02-12 (×2): 17 g via ORAL
  Filled 2012-02-11 (×2): qty 1

## 2012-02-11 MED ORDER — CALCIUM CARBONATE ANTACID 500 MG PO CHEW: 1.0000 | CHEWABLE_TABLET | Freq: Three times a day (TID) | ORAL | Status: DC

## 2012-02-11 MED ORDER — SODIUM CHLORIDE 0.9 % IJ SOLN
3.0000 mL | Freq: Two times a day (BID) | INTRAMUSCULAR | Status: DC
  Administered 2012-02-11: 3 mL via INTRAVENOUS

## 2012-02-11 NOTE — Progress Notes (Signed)
Utilization review completed.  

## 2012-02-11 NOTE — Progress Notes (Signed)
Printed out for patient a handout regarding GERD from [employer] site. Discussed with her some triggers that can aggravate GERD, and how to better manage it. Offered patient any additional resources or literature if she wishes to have it.

## 2012-02-11 NOTE — Progress Notes (Addendum)
[facility] Progress Note  Subjective:  Feeling a little better.  Still with some epigastric pain.  Going to try some clears again.  Having a hard time moving her bowels.  Objective:  Vital signs in last 24 hours: Temp:  [97.8 F (36.6 C)-98.9 F (37.2 C)] 98 F (36.7 C) (08/27 0630) Pulse Rate:  [76-97] 76  (08/27 0630) Resp:  [16-18] 16  (08/27 0630) BP: (102-117)/(63-77) 102/63 mmHg (08/27 0630) SpO2:  [100 %] 100 % (08/27 0630) Last BM Date: 02/09/12 General:   Alert,  Well-developed, in NAD Heart:  Regular rate and rhythm; no murmurs Pulm:  CTAB.  No W/R/R. Abdomen:  Soft, nondistended. Normal bowel sounds, without guarding, and without rebound.  Minimal epigastric TTP.   Extremities:  Without edema. Neurologic:  Alert and  oriented x4;  grossly normal neurologically. Psych:  Alert and cooperative. Normal mood and affect.  Intake/Output from previous day: 08/26 0701 - 08/27 0700 In: 892.5 [I.V.:592.5; IV Piggyback:300] Out: 3000 [Urine:3000]  BMET  Basename 02/11/12 0345 02/10/12 0850 02/09/12 0517  NA 136 135 135  K 3.3* 3.6 3.5  CL 104 102 104  CO2 24 26 23   GLUCOSE 76 88 112*  BUN 3* 3* 7  CREATININE 0.56 0.49* 0.45*  CALCIUM 8.4 8.5 8.2*   LFT  Basename 02/11/12 0345 02/08/12 2125  PROT 5.9* --  ALBUMIN 3.3* --  AST 14 --  ALT 10 --  ALKPHOS 42 --  BILITOT 0.3 --  BILIDIR -- <0.1  IBILI -- NOT CALCULATED     Mr Abd W/wo Cm/mrcp  02/11/2012  *RADIOLOGY REPORT*  Clinical Data:  Epigastric pain and nausea.  Recently diagnosed with pancreatitis.  Abnormal ultrasound.  MRI ABDOMEN WITHOUT AND WITH CONTRAST (INCLUDING MRCP)  Technique:  Multiplanar multisequence MR imaging of the abdomen was performed both before and after the administration of intravenous contrast. Heavily T2-weighted images of the biliary and pancreatic ducts were obtained, and three-dimensional MRCP images were rendered by post processing.  Contrast: 11mL MULTIHANCE GADOBENATE  DIMEGLUMINE 529 MG/ML IV SOLN  Comparison:  Abdominal ultrasound of 02/08/2012.  Findings:  Normal heart size without pericardial or pleural effusion.  There is a probable 8 mm cyst in the right breast on image 4 of series 3.  Normal liver.  A splenule.  Normal stomach.  No evidence of acute pancreatitis, pancreatic ductal dilatation, or peripancreatic fluid collection.  Pancreas enhances normally.  No gallstones.  No intra or extrahepatic biliary ductal dilatation. No evidence of common duct stone or obstructive mass.  Right hepatic artery pulsation artifact is identified on MRCP image 39 of series 4.  Normal adrenal glands.  Interpolar right renal cyst measures 8 mm on image 20 of series 3.  A 6 mm interpolar left renal lesion demonstrates fat characteristics, including on image 27 of series 3 and image 29 of series 9, consistent with an angiomyolipoma.  No abdominal adenopathy or ascites.  The dynamic pre and postcontrast T1-weighted series are mildly motion degraded.  IMPRESSION:  1.  No evidence of biliary ductal dilatation or choledocholithiasis. 2.  No evidence of acute pancreatitis nor its complications. 3.  Left renal angiomyolipoma. 4.  Right renal cyst.   Original Report Authenticated By: Consuello Bossier, M.D.     Assessment / Plan: 1.  Epigastric abdominal pain with nausea/vomiting and elevated lipase-MRI/MRCP is normal, showing no evidence of pancreatitis or biliary dilation.  ? Underlying GERD.  Will treat her empirically with BID PPI for a couple of  months.  I spoke with her about GERD dietary measures, which she will follow as well (including no ETOH).  She will then follow-up with Dr. Leone Payor on Tuesday September 24th at 2:45pm to assess improvement or need for endoscopy at that time.  No EGD for now.  Will advance diet as tolerated. 2.  Constipation-will begin Miralax daily.    LOS: 3 days   ZEHR, JESSICA D.  02/11/2012, 9:21 AM  Pager number [phone]    [facility] Attending  I have  also seen and assessed the patient and agree with the above note. I reviewed MR findings with her and called her father at her request. Renal lesions are cyst and angiomyolipoma, no GI problems.  She is eating solid food and is ok.  Plan for home tomorrow, treat GERD, no EtOH, see me as outpatient.  Iva Boop, MD, [facility] [phone] (pager) 02/11/2012 7:35 PM  Signing off

## 2012-02-11 NOTE — Discharge Summary (Signed)
Physician Discharge Summary  Katherine Manning WUJ:[id number] DOB: 1990/07/07 DOA: 02/08/2012  PCP: No primary provider on file.  Admit date: 02/08/2012 Discharge date: 02/11/2012  Recommendations for Outpatient Follow-up:  1. Needs GI input as out-patient if symtpoms recur  Discharge Diagnoses:  Principal Problem:  *Acute pancreatitis Active Problems:  Epigastric abdominal pain  Nausea & vomiting  Thrombocytopenia   Discharge Condition: Stable  Diet recommendation: bland  Filed Weights   02/09/12 0322  Weight: 57.7 kg (127 lb 3.3 oz)    History of present illness:   21 y.o. female presented to the ED with complaints of severe epigastric ABD Pain which has been occuring intermittently for the past year, but worsened today over the past 12 hours. She reports that the pain became so severe that it caused her to double over. She describes the pain as being sharp and was continuous today for 20 minutes and was a 10/10. She had nausea and vomiting X 1 today as well. She denies hemoptysis, and diarrhea, and also denies fevers and chills. She does report feeling increased indigestion symptoms.   lipase was over 3000 and trended down to 236 to 30 and then 0 on discharge-she experienced further pain and gastroenterology was consulted.  HOSPITAL COURSE  1. Abd pain-potentially secondary to a past common bile duct stone- MRCP and this admission did not show any gross abnormalities other than some coincidental renal cysts which are benign. Gastroenterology has recommended no further procedures at present time given she has no symptoms and if she is asymptomatic after eating 2-3 full meals of the next 24-hour she potentially can be discharged home.  I will optimize her reflux therapy with TUMS 500 3 times a day and then pantoprazole 40 mg twice a day for 2 months and if she has further symptomatology, she should be referred to a gastroenterologist of her choice. 2. Headache-likely 2/2 to caffeine  withdrawal vs hypoglycemia-IV tylenol ordered 3. relative TCP--potentially dilutional 2/2 to IVF, decreased rate to 50 cc/hr 8/26-rpt CBC showed a platelet count of 123 on 02/11/2012 labs. IV fluids were discontinued and this can be followed closely as an outpatient-get a CBC and a peripheral smear once she returns back to the outpatient setting  Consultants:  Gi-Dr. Leone Payor  Procedures:   US abdomen 02/08/12-Mild pelvicaliectasis on the left. Correlate clinically if concerned for distal obstruction or stone. Subcentimeter renal lesions, hypoechoic on the right and hyperechoic on the left, incompletely characterized. 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 Can be further characterized with a renal MRI follow-up.  MRI/MRCP1. No evidence of biliary ductal dilatation or choledocholithiasis. 2. No evidence of acute pancreatitis nor its complications.3. Left renal angiomyolipoma. 4. Right renal cyst.  Antibiotics:  NAD   Discharge Exam: Filed Vitals:   02/11/12 1301  BP: 122/62  Pulse: 86  Temp: 98.7 F (37.1 C)  Resp: 18   Filed Vitals:   02/11/12 0238 02/11/12 0630 02/11/12 1051 02/11/12 1301  BP: 117/65 102/63 113/69 122/62  Pulse: 97 76 81 86  Temp: 98.5 F (36.9 C) 98 F (36.7 C) 98.8 F (37.1 C) 98.7 F (37.1 C)  TempSrc: Oral Oral Oral Oral  Resp: 16 16 18 18   Height:      Weight:      SpO2: 100% 100% 100% 100%   General: NAD  Cardiovascular: s1 s2 no m/r/g  Respiratory: clear  Abdomen: mild epig tenderness  Skin no rash  Neuro: Anxious.   Discharge Instructions  Discharge Orders    Future Appointments: Provider: Department: Dept Phone: Center:   03/10/2012 2:45 PM Iva Boop, MD [facility] [phone] [facility]     Medication List  As of 02/11/2012  4:25 PM   STOP taking these medications         CENTRUM PO      psyllium 95 % Pack         TAKE these medications         calcium carbonate 500 MG chewable  tablet   Commonly known as: TUMS - dosed in mg elemental calcium   Chew 1 tablet (200 mg of elemental calcium total) by mouth 3 (three) times daily. heatburn      FLUoxetine 90 MG DR capsule   Commonly known as: PROZAC WEEKLY   Take 90 mg by mouth every 7 (seven) days.      omeprazole 20 MG capsule   Commonly known as: PRILOSEC   Take 40 mg by mouth daily.      pantoprazole 40 MG tablet   Commonly known as: PROTONIX   Take 1 tablet (40 mg total) by mouth daily.           Follow-up Information    Follow up with Stan Head, MD on 03/10/2012. (2:45pm)    Contact information:   [address] [facility] [address] [place] [address] [place] [address] [phone]           The results of significant diagnostics from this hospitalization (including imaging, microbiology, ancillary and laboratory) are listed below for reference.    Significant Diagnostic Studies: US Abdomen Complete  02/08/2012  *RADIOLOGY REPORT*  Clinical Data:  Epigastric abdominal pain  COMPLETE ABDOMINAL ULTRASOUND  Comparison:  None.  Findings:  Gallbladder:  No gallstones, gallbladder wall thickening, or pericholecystic fluid.  Common bile duct:  Measures 3 mm, within normal limits.  Liver:  Echogenicity within normal limits.  No focal abnormality.  IVC:  Appears normal.  Pancreas:  No focal abnormality identified within the head/neck. The body/tail are obscured by overlying bowel gas artifact.  Spleen:  Measures 7 cm, within normal limits.  Right Kidney:  Measures 11.4 cm.  There is a subcentimeter hypoechoic/nearly anechoic focus along the interpolar right kidney, favored to reflect a cyst by incomplete characterize.  Left Kidney:  Measures 11 cm. Mild pelvicaliectasis.  8 mm hyperechoic focus lower pole.  Abdominal aorta:  No aneurysm identified.  IMPRESSION: Mild pelvicaliectasis on the left.  Correlate clinically if concerned for distal obstruction or stone.  Subcentimeter renal lesions, hypoechoic on the  right and hyperechoic on the left, incompletely characterized.  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  Can be further characterized with a renal MRI follow-up.   Original Report Authenticated By: Waneta Martins, M.D.    Mr 3d Recon At Scanner  02/11/2012  *RADIOLOGY REPORT*  Clinical Data:  Epigastric pain and nausea.  Recently diagnosed with pancreatitis.  Abnormal ultrasound.  MRI ABDOMEN WITHOUT AND WITH CONTRAST (INCLUDING MRCP)  Technique:  Multiplanar multisequence MR imaging of the abdomen was performed both before and after the administration of intravenous contrast. Heavily T2-weighted images of the biliary and pancreatic ducts were obtained, and three-dimensional MRCP images were rendered by post processing.  Contrast: 11mL MULTIHANCE GADOBENATE DIMEGLUMINE 529 MG/ML IV SOLN  Comparison:  Abdominal ultrasound of 02/08/2012.  Findings:  Normal heart size without pericardial or pleural effusion.  There is a probable 8 mm  cyst in the right breast on image 4 of series 3.  Normal liver.  A splenule.  Normal stomach.  No evidence of acute pancreatitis, pancreatic ductal dilatation, or peripancreatic fluid collection.  Pancreas enhances normally.  No gallstones.  No intra or extrahepatic biliary ductal dilatation. No evidence of common duct stone or obstructive mass.  Right hepatic artery pulsation artifact is identified on MRCP image 39 of series 4.  Normal adrenal glands.  Interpolar right renal cyst measures 8 mm on image 20 of series 3.  A 6 mm interpolar left renal lesion demonstrates fat characteristics, including on image 27 of series 3 and image 29 of series 9, consistent with an angiomyolipoma.  No abdominal adenopathy or ascites.  The dynamic pre and postcontrast T1-weighted series are mildly motion degraded.  IMPRESSION:  1.  No evidence of biliary ductal dilatation or choledocholithiasis. 2.  No evidence of acute pancreatitis nor its complications. 3.  Left  renal angiomyolipoma. 4.  Right renal cyst.   Original Report Authenticated By: Consuello Bossier, M.D.    Mr Abd W/wo Cm/mrcp  02/11/2012  *RADIOLOGY REPORT*  Clinical Data:  Epigastric pain and nausea.  Recently diagnosed with pancreatitis.  Abnormal ultrasound.  MRI ABDOMEN WITHOUT AND WITH CONTRAST (INCLUDING MRCP)  Technique:  Multiplanar multisequence MR imaging of the abdomen was performed both before and after the administration of intravenous contrast. Heavily T2-weighted images of the biliary and pancreatic ducts were obtained, and three-dimensional MRCP images were rendered by post processing.  Contrast: 11mL MULTIHANCE GADOBENATE DIMEGLUMINE 529 MG/ML IV SOLN  Comparison:  Abdominal ultrasound of 02/08/2012.  Findings:  Normal heart size without pericardial or pleural effusion.  There is a probable 8 mm cyst in the right breast on image 4 of series 3.  Normal liver.  A splenule.  Normal stomach.  No evidence of acute pancreatitis, pancreatic ductal dilatation, or peripancreatic fluid collection.  Pancreas enhances normally.  No gallstones.  No intra or extrahepatic biliary ductal dilatation. No evidence of common duct stone or obstructive mass.  Right hepatic artery pulsation artifact is identified on MRCP image 39 of series 4.  Normal adrenal glands.  Interpolar right renal cyst measures 8 mm on image 20 of series 3.  A 6 mm interpolar left renal lesion demonstrates fat characteristics, including on image 27 of series 3 and image 29 of series 9, consistent with an angiomyolipoma.  No abdominal adenopathy or ascites.  The dynamic pre and postcontrast T1-weighted series are mildly motion degraded.  IMPRESSION:  1.  No evidence of biliary ductal dilatation or choledocholithiasis. 2.  No evidence of acute pancreatitis nor its complications. 3.  Left renal angiomyolipoma. 4.  Right renal cyst.   Original Report Authenticated By: Consuello Bossier, M.D.     Microbiology: No results found for this or any  previous visit (from the past 240 hour(s)).   Labs: Basic Metabolic Panel:  Lab 02/11/12 8119 02/10/12 0850 02/09/12 0517 02/08/12 2125  NA 136 135 135 137  K 3.3* 3.6 3.5 4.2  CL 104 102 104 102  CO2 24 26 23 25   GLUCOSE 76 88 112* 104*  BUN 3* 3* 7 15  CREATININE 0.56 0.49* 0.45* 0.50  CALCIUM 8.4 8.5 8.2* 9.1  MG -- -- -- --  PHOS -- -- -- --   Liver Function Tests:  Lab 02/11/12 0345 02/10/12 0850 02/08/12 2125  AST 14 13 18   ALT 10 11 13   ALKPHOS 42 40 48  BILITOT 0.3 0.3  0.1*  PROT 5.9* 5.8* 7.1  ALBUMIN 3.3* 3.3* 3.9    Lab 02/10/12 0850 02/09/12 0500 02/08/12 2125  LIPASE 30 236* >3000*  AMYLASE -- -- --   No results found for this basename: AMMONIA:5 in the last 168 hours CBC:  Lab 02/11/12 1228 02/09/12 0517 02/08/12 2125  WBC 6.0 7.8 10.3  NEUTROABS -- -- --  HGB 13.2 12.2 13.6  HCT 37.6 34.7* 39.8  MCV 81.4 81.1 81.9  PLT 123* 112* 133*   Cardiac Enzymes: No results found for this basename: CKTOTAL:5,CKMB:5,CKMBINDEX:5,TROPONINI:5 in the last 168 hours BNP: BNP (last 3 results) No results found for this basename: PROBNP:3 in the last 8760 hours CBG: No results found for this basename: GLUCAP:5 in the last 168 hours  Time coordinating discharge: 35 minutes  Signed:  Rhetta Mura  [facility] 02/11/2012, 4:25 PM

## 2012-02-12 DIAGNOSIS — K219 Gastro-esophageal reflux disease without esophagitis: Secondary | ICD-10-CM

## 2012-02-12 LAB — BASIC METABOLIC PANEL
BUN: 5 mg/dL — ABNORMAL LOW (ref 6–23)
Calcium: 9 mg/dL (ref 8.4–10.5)
GFR calc non Af Amer: >90 mL/min (ref >90)
Glucose, Bld: 91 mg/dL (ref 70–99)
Sodium: 138 mEq/L (ref 135–145)

## 2012-02-12 MED ORDER — GI COCKTAIL ~~LOC~~
30.0000 mL | Freq: Three times a day (TID) | ORAL | Status: DC | PRN
  Administered 2012-02-12: 30 mL via ORAL
  Filled 2012-02-12: qty 30

## 2012-02-12 MED ORDER — PANTOPRAZOLE SODIUM 40 MG PO TBEC
40.0000 mg | DELAYED_RELEASE_TABLET | Freq: Two times a day (BID) | ORAL | Status: DC
  Filled 2012-02-12: qty 1

## 2012-02-12 MED ORDER — GI COCKTAIL ~~LOC~~: 30.0000 mL | Freq: Three times a day (TID) | ORAL | Status: DC | PRN

## 2012-02-12 MED ORDER — CALCIUM CARBONATE ANTACID 500 MG PO CHEW: 1.0000 | CHEWABLE_TABLET | Freq: Three times a day (TID) | ORAL | Status: AC | PRN

## 2012-02-12 MED ORDER — PANTOPRAZOLE SODIUM 40 MG PO TBEC: 40.0000 mg | DELAYED_RELEASE_TABLET | Freq: Two times a day (BID) | ORAL | Status: DC

## 2012-02-12 NOTE — Progress Notes (Signed)
Katherine Manning was hospitalized at [facility] in [place] from 02/09/2012 through 02/12/2012 under my care. Please excuse her for any missed absences. You may call [phone] with any questions. Thank you for your attention to this matter   Sincerely,    Ramiro Harvest, M.D.

## 2012-02-12 NOTE — Progress Notes (Signed)
[facility] PROGRESS NOTE  Katherine Manning ZOX:[id number] DOB: 11-Jan-1991 DOA: 02/08/2012 PCP: No primary provider on file.  Assessment/Plan: Principal Problem:  *Acute pancreatitis Active Problems:  Epigastric abdominal pain  Nausea & vomiting  Thrombocytopenia  GERD (gastroesophageal reflux disease)  #1 acute pancreatitis Questionable etiology. Likely secondary to a passed stone. Clinical improvement. Lipase levels have trended down. Patient started on a regular bland diet. Supportive care. Follow.  #2 probable GERD PPI twice a day per GI recommendations. Patient also on TUMS. Follow up with GI as outpatient.  #3 thrombocytopenia No active bleeding. Platelet count is improving. Followup as outpatient.  Code Status: Full Family Communication: No family at bedside Disposition Plan: Home when medically stable   Brief narrative: Katherine Manning is a 21 y.o. female presents to the ED with complaints of severe epigastric ABD Pain which has been occuring intermittently for the past year, but worsened today over the past 12 hours. She reports that the pain became so severe that it caused her to double over. She describes the pain as being sharp and was continuous today for 20 minutes and was a 10/10. She had nausea and vomiting X 1 today as well. She denies hemoptysis, and diarrhea, and also denies fevers and chills. She does report feeling increased indigestion symptoms   Consultants:  GI: [facility] :Dr. Leone Payor  Procedures:  Abdominal ultrasound 02/08/2012  MRI of the abdomen 02/10/2012  Antibiotics:  None  HPI/Subjective: Patient states epigastric abdominal pain improved since admission. Patient is complaining of lower midsternal chest pain this is described as a burning sensation. Patient denies any emesis. Patient currently eating breakfast.  Objective: Filed Vitals:   02/11/12 1051 02/11/12 1301 02/11/12 2130 02/12/12 0600  BP: 113/69 122/62 112/73 100/78   Pulse: 81 86 74 84  Temp: 98.8 F (37.1 C) 98.7 F (37.1 C) 98.5 F (36.9 C) 98.6 F (37 C)  TempSrc: Oral Oral Oral Oral  Resp: 18 18 18 16   Height:      Weight:      SpO2: 100% 100% 100% 100%    Intake/Output Summary (Last 24 hours) at 02/12/12 1025 Last data filed at 02/11/12 2112  Gross per 24 hour  Intake    240 ml  Output   3000 ml  Net  -2760 ml   Filed Weights   02/09/12 0322  Weight: 57.7 kg (127 lb 3.3 oz)    Exam:   General:  NAD  Cardiovascular: RRR no m/r/g. No LE edema  Respiratory: CTAB. No wheezing.  Abdomen: Soft, ND, min to mild TTP in epigastrium, + BS  Data Reviewed: Basic Metabolic Panel:  Lab 02/12/12 8119 02/11/12 0345 02/10/12 0850 02/09/12 0517 02/08/12 2125  NA 138 136 135 135 137  K 3.5 3.3* 3.6 3.5 4.2  CL 103 104 102 104 102  CO2 27 24 26 23 25   GLUCOSE 91 76 88 112* 104*  BUN 5* 3* 3* 7 15  CREATININE 0.50 0.56 0.49* 0.45* 0.50  CALCIUM 9.0 8.4 8.5 8.2* 9.1  MG -- -- -- -- --  PHOS -- -- -- -- --   Liver Function Tests:  Lab 02/11/12 0345 02/10/12 0850 02/08/12 2125  AST 14 13 18   ALT 10 11 13   ALKPHOS 42 40 48  BILITOT 0.3 0.3 0.1*  PROT 5.9* 5.8* 7.1  ALBUMIN 3.3* 3.3* 3.9    Lab 02/10/12 0850 02/09/12 0500 02/08/12 2125  LIPASE 30 236* >3000*  AMYLASE -- -- --   No results found  for this basename: AMMONIA:5 in the last 168 hours CBC:  Lab 02/11/12 1228 02/09/12 0517 02/08/12 2125  WBC 6.0 7.8 10.3  NEUTROABS -- -- --  HGB 13.2 12.2 13.6  HCT 37.6 34.7* 39.8  MCV 81.4 81.1 81.9  PLT 123* 112* 133*   Cardiac Enzymes: No results found for this basename: CKTOTAL:5,CKMB:5,CKMBINDEX:5,TROPONINI:5 in the last 168 hours BNP (last 3 results) No results found for this basename: PROBNP:3 in the last 8760 hours CBG: No results found for this basename: GLUCAP:5 in the last 168 hours  No results found for this or any previous visit (from the past 240 hour(s)).   Studies: US Abdomen Complete  02/08/2012   *RADIOLOGY REPORT*  Clinical Data:  Epigastric abdominal pain  COMPLETE ABDOMINAL ULTRASOUND  Comparison:  None.  Findings:  Gallbladder:  No gallstones, gallbladder wall thickening, or pericholecystic fluid.  Common bile duct:  Measures 3 mm, within normal limits.  Liver:  Echogenicity within normal limits.  No focal abnormality.  IVC:  Appears normal.  Pancreas:  No focal abnormality identified within the head/neck. The body/tail are obscured by overlying bowel gas artifact.  Spleen:  Measures 7 cm, within normal limits.  Right Kidney:  Measures 11.4 cm.  There is a subcentimeter hypoechoic/nearly anechoic focus along the interpolar right kidney, favored to reflect a cyst by incomplete characterize.  Left Kidney:  Measures 11 cm. Mild pelvicaliectasis.  8 mm hyperechoic focus lower pole.  Abdominal aorta:  No aneurysm identified.  IMPRESSION: Mild pelvicaliectasis on the left.  Correlate clinically if concerned for distal obstruction or stone.  Subcentimeter renal lesions, hypoechoic on the right and hyperechoic on the left, incompletely characterized.  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  Can be further characterized with a renal MRI follow-up.   Original Report Authenticated By: Waneta Martins, M.D.    Mr 3d Recon At Scanner  02/11/2012  *RADIOLOGY REPORT*  Clinical Data:  Epigastric pain and nausea.  Recently diagnosed with pancreatitis.  Abnormal ultrasound.  MRI ABDOMEN WITHOUT AND WITH CONTRAST (INCLUDING MRCP)  Technique:  Multiplanar multisequence MR imaging of the abdomen was performed both before and after the administration of intravenous contrast. Heavily T2-weighted images of the biliary and pancreatic ducts were obtained, and three-dimensional MRCP images were rendered by post processing.  Contrast: 11mL MULTIHANCE GADOBENATE DIMEGLUMINE 529 MG/ML IV SOLN  Comparison:  Abdominal ultrasound of 02/08/2012.  Findings:  Normal heart size without pericardial or  pleural effusion.  There is a probable 8 mm cyst in the right breast on image 4 of series 3.  Normal liver.  A splenule.  Normal stomach.  No evidence of acute pancreatitis, pancreatic ductal dilatation, or peripancreatic fluid collection.  Pancreas enhances normally.  No gallstones.  No intra or extrahepatic biliary ductal dilatation. No evidence of common duct stone or obstructive mass.  Right hepatic artery pulsation artifact is identified on MRCP image 39 of series 4.  Normal adrenal glands.  Interpolar right renal cyst measures 8 mm on image 20 of series 3.  A 6 mm interpolar left renal lesion demonstrates fat characteristics, including on image 27 of series 3 and image 29 of series 9, consistent with an angiomyolipoma.  No abdominal adenopathy or ascites.  The dynamic pre and postcontrast T1-weighted series are mildly motion degraded.  IMPRESSION:  1.  No evidence of biliary ductal dilatation or choledocholithiasis. 2.  No evidence of acute pancreatitis nor its complications. 3.  Left renal angiomyolipoma. 4.  Right renal cyst.   Original Report Authenticated By: Consuello Bossier, M.D.    Mr Abd W/wo Cm/mrcp  02/11/2012  *RADIOLOGY REPORT*  Clinical Data:  Epigastric pain and nausea.  Recently diagnosed with pancreatitis.  Abnormal ultrasound.  MRI ABDOMEN WITHOUT AND WITH CONTRAST (INCLUDING MRCP)  Technique:  Multiplanar multisequence MR imaging of the abdomen was performed both before and after the administration of intravenous contrast. Heavily T2-weighted images of the biliary and pancreatic ducts were obtained, and three-dimensional MRCP images were rendered by post processing.  Contrast: 11mL MULTIHANCE GADOBENATE DIMEGLUMINE 529 MG/ML IV SOLN  Comparison:  Abdominal ultrasound of 02/08/2012.  Findings:  Normal heart size without pericardial or pleural effusion.  There is a probable 8 mm cyst in the right breast on image 4 of series 3.  Normal liver.  A splenule.  Normal stomach.  No evidence of acute  pancreatitis, pancreatic ductal dilatation, or peripancreatic fluid collection.  Pancreas enhances normally.  No gallstones.  No intra or extrahepatic biliary ductal dilatation. No evidence of common duct stone or obstructive mass.  Right hepatic artery pulsation artifact is identified on MRCP image 39 of series 4.  Normal adrenal glands.  Interpolar right renal cyst measures 8 mm on image 20 of series 3.  A 6 mm interpolar left renal lesion demonstrates fat characteristics, including on image 27 of series 3 and image 29 of series 9, consistent with an angiomyolipoma.  No abdominal adenopathy or ascites.  The dynamic pre and postcontrast T1-weighted series are mildly motion degraded.  IMPRESSION:  1.  No evidence of biliary ductal dilatation or choledocholithiasis. 2.  No evidence of acute pancreatitis nor its complications. 3.  Left renal angiomyolipoma. 4.  Right renal cyst.   Original Report Authenticated By: Consuello Bossier, M.D.     Scheduled Meds:   . acetaminophen  1,000 mg Intravenous Q6H  . enoxaparin (LOVENOX) injection  40 mg Subcutaneous Q24H  . FLUoxetine  90 mg Oral Weekly  . pantoprazole (PROTONIX) IV  40 mg Intravenous Q12H  . polyethylene glycol  17 g Oral Daily  . sodium chloride  3 mL Intravenous Q12H   Continuous Infusions:   . DISCONTD: sodium chloride 1,000 mL (02/11/12 0222)    Principal Problem:  *Acute pancreatitis Active Problems:  Epigastric abdominal pain  Nausea & vomiting  Thrombocytopenia  GERD (gastroesophageal reflux disease)    Time spent: > 30 mins    [facility]  [facility] Pager [phone]. If 8PM-8AM, please contact night-coverage at [email or web address], password [facility] 02/12/2012, 10:25 AM  LOS: 4 days

## 2012-02-12 NOTE — Progress Notes (Signed)
D/C instructions given to pt who verbalizes understanding and repeats F/U appointment. Pt will call cab for transportation. dph

## 2012-03-10 ENCOUNTER — Other Ambulatory Visit (INDEPENDENT_AMBULATORY_CARE_PROVIDER_SITE_OTHER): Payer: BC Managed Care – PPO

## 2012-03-10 ENCOUNTER — Ambulatory Visit (INDEPENDENT_AMBULATORY_CARE_PROVIDER_SITE_OTHER): Payer: BC Managed Care – PPO | Admitting: Internal Medicine

## 2012-03-10 VITALS — BP 108/64 | HR 66 | Ht 61.5 in | Wt 119.0 lb

## 2012-03-10 DIAGNOSIS — D696 Thrombocytopenia, unspecified: Secondary | ICD-10-CM

## 2012-03-10 DIAGNOSIS — R1013 Epigastric pain: Secondary | ICD-10-CM

## 2012-03-10 LAB — CBC WITH DIFFERENTIAL/PLATELET
Basophils Absolute: 0 10*3/uL (ref 0.0–0.1)
Eosinophils Absolute: 0.1 10*3/uL (ref 0.0–0.7)
Lymphocytes Relative: 25.5 % (ref 12.0–46.0)
MCHC: 32.5 g/dL (ref 30.0–36.0)
Neutrophils Relative %: 65.8 % (ref 43.0–77.0)
Platelets: 123 10*3/uL — ABNORMAL LOW (ref 150.0–400.0)
RBC: 4.75 Mil/uL (ref 3.87–5.11)
RDW: 12.5 % (ref 11.5–14.6)

## 2012-03-10 MED ORDER — PANTOPRAZOLE SODIUM 40 MG PO TBEC: 40.0000 mg | DELAYED_RELEASE_TABLET | Freq: Two times a day (BID) | ORAL | Status: DC

## 2012-03-10 NOTE — Patient Instructions (Addendum)
Your physician has requested that you go to the basement for the following lab work before leaving today: CBC/Diff  Please try and reduce your pantoprazole over the next month to one a day. We are giving you a refill for the pantoprazole today.  Follow-up with Dr. Leone Payor in 2 months.  Thank you for choosing me and [facility].  Iva Boop, M.D., [facility]

## 2012-03-10 NOTE — Progress Notes (Signed)
  Subjective:    Patient ID: Katherine Manning, female    DOB: 1990/09/12, 21 y.o.   MRN: [id number]  HPI Patient returns about one month after she was seen in the hospital. She had a marked elevation in her lipase, and amylase. She had been drinking several shots of alcohol before that. She had rapid improvement in her pancreatic tests and had an unremarkable MRCP. I placed her on twice a day PPI which is helped quite a bit and she feels much better. However she's been very restricted about her diet with respect to GERD, and has lost some weight and is little bit concerned about that. She stopped drinking coffee but has been taking some T. because she has trouble making it without some caffeine, in school. Her graduate studies are going well.  Medications, allergies, past medical history, past surgical history, family history and social history are reviewed and updated in the EMR.  Review of Systems Per history of present illness    Objective:   Physical Exam Well Developed well-nourished young white woman in no acute distress Abdomen is soft and nontender, there is no splenomegaly     Lab Results  Component Value Date   WBC 6.0 02/11/2012   HGB 13.2 02/11/2012   HCT 37.6 02/11/2012   MCV 81.4 02/11/2012   PLT 123* 02/11/2012    Assessment & Plan:   1. Dyspepsia   2. Thrombocytopenia    1. I don't know if she has GERD, and I don't think she actually had pancreatitis. She did it was extremely transient and probably related to alcohol. 2. She has avoided alcohol completely but wonders if she can take a drink occasionally and I think that is fine as long as she is responsible 3. We'll see if she can taper to 1 pantoprazole daily over the next month and then see if she can get off that completely. Would like not to committed to long-term therapy if not necessary. 4. CBC repeat today because of thrombocytopenia. 5. Return in about 2 months.

## 2012-03-11 ENCOUNTER — Encounter: Payer: Self-pay | Admitting: Internal Medicine

## 2012-03-11 NOTE — Progress Notes (Signed)
Quick Note:  She still has mildly low platelets - sometimes this is transient though has had x 1 month I recommend she get another CBC just before next office visit - if still low then will discuss hematology referral ______

## 2012-03-12 ENCOUNTER — Other Ambulatory Visit: Payer: Self-pay

## 2012-03-12 DIAGNOSIS — D696 Thrombocytopenia, unspecified: Secondary | ICD-10-CM

## 2012-04-02 ENCOUNTER — Telehealth: Payer: Self-pay

## 2012-04-02 ENCOUNTER — Other Ambulatory Visit: Payer: Self-pay

## 2012-04-02 MED ORDER — PANTOPRAZOLE SODIUM 40 MG PO TBEC: DELAYED_RELEASE_TABLET | ORAL | Status: AC

## 2012-04-02 NOTE — Telephone Encounter (Signed)
Pt called back in and reports under stress from grad. School and she is still taking Protonix 40mg  twice a day.  She said she forgot he wanted her to taper to one a day.  She will try and start doing this.  She will call back to get her follow-up appointment.  I told her I will try to get  Protonix prior auth. Done today.  She only has 10 pills left.

## 2012-04-02 NOTE — Telephone Encounter (Signed)
[facility], no prior authorization needed.  Hopefully she can taper to once a day and then come completely off as Dr. Leone Payor noted in the 03/10/12 office note.

## 2012-04-02 NOTE — Telephone Encounter (Signed)
Lm for pt to call me back so I can get update on how shes doing.  I need to confirm how much pantoprazole she is taking a day.

## 2012-04-10 ENCOUNTER — Telehealth: Payer: Self-pay | Admitting: Internal Medicine

## 2012-04-10 NOTE — Telephone Encounter (Signed)
Patient reports worsening chest pain that radiates into her back.  She has had some burning esophageal pain despite BID protonix.  She will come in and see Willette Cluster RNP on Monday at2:00

## 2012-04-13 ENCOUNTER — Other Ambulatory Visit (INDEPENDENT_AMBULATORY_CARE_PROVIDER_SITE_OTHER): Payer: BC Managed Care – PPO

## 2012-04-13 ENCOUNTER — Encounter: Payer: Self-pay | Admitting: Nurse Practitioner

## 2012-04-13 ENCOUNTER — Ambulatory Visit (INDEPENDENT_AMBULATORY_CARE_PROVIDER_SITE_OTHER): Payer: BC Managed Care – PPO | Admitting: Nurse Practitioner

## 2012-04-13 VITALS — BP 110/70 | HR 60 | Ht 61.5 in | Wt 120.0 lb

## 2012-04-13 DIAGNOSIS — R079 Chest pain, unspecified: Secondary | ICD-10-CM

## 2012-04-13 DIAGNOSIS — D696 Thrombocytopenia, unspecified: Secondary | ICD-10-CM

## 2012-04-13 LAB — CBC WITH DIFFERENTIAL/PLATELET
Eosinophils Relative: 0.8 % (ref 0.0–5.0)
HCT: 37.9 % (ref 36.0–46.0)
Hemoglobin: 12.4 g/dL (ref 12.0–15.0)
Lymphs Abs: 1.6 10*3/uL (ref 0.7–4.0)
Monocytes Relative: 8.9 % (ref 3.0–12.0)
Neutro Abs: 3.8 10*3/uL (ref 1.4–7.7)
WBC: 6 10*3/uL (ref 4.5–10.5)

## 2012-04-13 LAB — AMYLASE: Amylase: 84 U/L (ref 27–131)

## 2012-04-13 MED ORDER — SUCRALFATE 1 GM/10ML PO SUSP: 1.0000 g | Freq: Three times a day (TID) | ORAL | Status: DC

## 2012-04-13 NOTE — Progress Notes (Addendum)
04/13/2012 Katherine Manning [id number] 1990/07/17   History of Present Illness:   Patient is a 21 year old female known to Dr. Leone Payor for ? history of pancreatitis and GERD. Her problems with GERD began in July after she began smoking hookah. In August she was hospitalized with upper abdominal pain and lipase greater than 3000. Her MRCP was unremarkable. Patient was last seen in late September for hospital followup.  At the time of her September visit patient was doing better and we advised liberalization of her diet a bit. We also recommend tapering Protonix to once a day and return for followup in 2 months. Patient called today with recurrent GERD type pain. She really has not been able to decrease  PPI to once daily . She has chest pain radiating through to the back, It doesn't matter what she she tries to eat or drink though alcohol definitely exacerbates the pain. Patient has significantly reduced her caffeine intake.The pain is sometimes present even before she has anything to eat or drink in the morning. Taking a deep breath can aggravate the pain.  Her current pain is different than that of when hospitalized in August with pancreatitis.  Patient is from [place]. She is planning a trip to [place] in December and plans to see a gastroenterologist up there for further evaluation.  Current Medications, Allergies, Past Medical History, Past Surgical History, Family History and Social History were reviewed in [employer] record.   Physical Exam: General: Well developed , white female in no acute distress Head: Normocephalic and atraumatic Eyes:  sclerae anicteric, conjunctiva pink  Ears: Normal auditory acuity Lungs: Clear throughout to auscultation Heart: Regular rate and rhythm Abdomen: Soft, non tender and non distended. No masses, no hepatomegaly. Normal bowel sounds Musculoskeletal: Symmetrical with no gross deformities . Chest wall tenderness (over mid to distal  sternum). Extremities: No edema  Neurological: Alert oriented x 4, grossly nonfocal Psychological:  Alert and cooperative. Normal mood and affect  Assessment and Recommendations:  chest pain, radiating through to the back despite high-dose PPI therapy. This pain is different than when patient was hospitalized with pancreatitis but will obtain serum lipase today. If this is GERD related pain it is unclear why she is not responding to high-dose PPI therapy. Musculoskeletal pain is not excluded as she does have chest wall tenderness on exam and deep breaths can exacerbate the pain. It does seem reasonable to do an upper endoscopy for further evaluation. The patient would like to discuss this with her parents as she is going back home to [place] in December and plans to see a gastroenterologist there. Patient will call us back regarding whether she would like to proceed with EGD with Dr. Leone Payor. In the meantime, continue twice daily PPI. Trial of Carafate slurry before meals and at bedtime.  Addendum: Reviewed and agree with initial management. Agree with EGD.  Esophageal spasm also possible, but would proceed with EGD 1st before consideration of manometry. Beverley Fiedler, MD

## 2012-04-13 NOTE — Patient Instructions (Addendum)
Please go to the basement level to have your labs drawn.  We sent a prescription for [facility] to [facility].  Continue to take the Protonix twice daily.  Call us back and let us know what you decide about having a procedure at our facility, [facility].

## 2012-04-14 ENCOUNTER — Encounter: Payer: Self-pay | Admitting: Nurse Practitioner

## 2012-04-14 NOTE — Progress Notes (Signed)
Quick Note:  CBC is normal - including platelets Please let her know She should get copies of her records to take to gastroenterologist she will see in [place] when she goes home over school break  ______

## 2012-05-05 ENCOUNTER — Ambulatory Visit: Payer: BC Managed Care – PPO | Admitting: Internal Medicine

## 2012-05-17 HISTORY — PX: ESOPHAGOGASTRODUODENOSCOPY: SHX1529

## 2012-06-18 ENCOUNTER — Ambulatory Visit (INDEPENDENT_AMBULATORY_CARE_PROVIDER_SITE_OTHER): Payer: BC Managed Care – PPO | Admitting: Internal Medicine

## 2012-06-18 ENCOUNTER — Encounter: Payer: Self-pay | Admitting: Internal Medicine

## 2012-06-18 VITALS — BP 100/60 | HR 60 | Ht 61.5 in | Wt 121.0 lb

## 2012-06-18 DIAGNOSIS — R1013 Epigastric pain: Secondary | ICD-10-CM

## 2012-06-18 DIAGNOSIS — K3189 Other diseases of stomach and duodenum: Secondary | ICD-10-CM

## 2012-06-18 DIAGNOSIS — D696 Thrombocytopenia, unspecified: Secondary | ICD-10-CM

## 2012-06-18 DIAGNOSIS — R12 Heartburn: Secondary | ICD-10-CM

## 2012-06-18 NOTE — Patient Instructions (Addendum)
We will obtain the consult note and path report from your EGD done in [place] and be in touch with you.  If you have not heard from [place] within two weeks give [place] a call.  Try gavascon over the counter for break through symptoms.  Thank you for choosing me and [facility].  Iva Boop, M.D., [facility]

## 2012-06-18 NOTE — Progress Notes (Addendum)
  Subjective:    Patient ID: Katherine Manning, female    DOB: [birth year], 22 y.o.   MRN: [id number]  HPI This young woman returns for followup. She has had dyspepsia and heartburn symptoms. These started around the time she was admitted to the hospital with abdominal pain and markedly relation lipase and amylase but no clear evidence of pancreatitis. She went home for school break, and she had an upper endoscopy in [place] which showed possible gastritis, described as scattered mild inflammation characterized by granularity in the gastric antrum. She tells me the biopsies were normal though I don't have those, only have the endoscopy report from 06/01/2012.  She continues with heartburn at times and upper abdominal discomfort. She is on sucralfate and twice daily PPI, pantoprazole. She will use antacids with some relief intermittently. She notes that if she drinks too much alcohol meaning a few drinks or more her symptoms will be worse but they're not always associated with alcohol. She indicates that she understands that this is probably not anything serious particularly as bothersome and disrupting her quality of life. She denies odynophagia or dysphagia but does seem to have some globus sensation. More recently she did have some palpitations briefly but denies any overt symptoms of panic attack. She does understand and know that she is on therapy for anxiety but feels like that the SSRI therapy is working.  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  Medications, allergies, past medical history, past surgical history, family history and social history are reviewed and updated in the EMR.   Review of Systems As per history of present illness    Objective:   Physical Exam General:  NAD Eyes:   anicteric Lungs:  clear Heart:  S1S2 no rubs, murmurs or gallops Abdomen:  soft and nontender, BS+  Data Reviewed:  06/01/2012 upper endoscopy report from [place]. I have her last copies of  Dr.Jaffin's consultation note and the pathology from the gastric biopsies.     Assessment & Plan:   1. Heartburn   2. Dyspepsia     1. I think she probably has a functional heartburn and dyspepsia problems. 2. I will review the biopsy results and the [place] gastroenterologist consultation for completeness. 3. Is that is done, unless there is something revealing there I think the options would be a 24-hour impedance study versus empiric tricyclic agent. It sounds like she is leaning towards the impedance study. 4. We will call her after I have had a chance to review the outside records, she is instructed to call us back if she has not heard from this office in 2 weeks.    Pathology from EGD 06/01/2012  Antral bxs no significant pathological change and negative for H pylori  Esophageal biopsies normal   Will contact her and ask her to complete a 24 hour impedance study - will need to request from [facility].

## 2012-06-29 ENCOUNTER — Telehealth: Payer: Self-pay

## 2012-06-29 NOTE — Telephone Encounter (Signed)
I have notified the patient that she will be contacted by [facility] about setting up impedence study.  She is advised to call be back if she has not heard from then in the next few weeks.  She stated she wants to discuss this with her MD in [place] also prior to scheduling.  She will let us know if her plans change.

## 2012-06-29 NOTE — Telephone Encounter (Signed)
Message copied by Annett Fabian on Mon Jun 29, 2012  8:55 AM ------      Message from: Iva Boop      Created: Sun Jun 28, 2012 10:37 AM      Regarding: needs impedance       Please let her know the biopsies from her EGD were all normal            Sounds like next step is a 24 hour impedance study on meds at [facility]            I routed the addndum to her office note to you also.

## 2012-07-09 NOTE — Telephone Encounter (Signed)
Patient was contacted by [facility] to schedule impedence study.  She declined to schedule with them.  She wants to discuss with her MD in [place]

## 2012-07-10 ENCOUNTER — Telehealth: Payer: Self-pay | Admitting: Internal Medicine

## 2012-07-10 NOTE — Telephone Encounter (Signed)
Noted  

## 2012-07-10 NOTE — Telephone Encounter (Signed)
Spoke to patient and suggested she call her insurance company to see what PPI they will cover.  I also let her know that Protonix does come OTC just in less mg. Per tablet.  She reports not being able to cut down to one a day, still taking Protonix 40mg  twice a day, so I explained if she gets the OTC she would need to take more tablets to equal what she is on now.  She verbalized understanding.

## 2012-07-21 ENCOUNTER — Telehealth: Payer: Self-pay

## 2012-07-21 NOTE — Telephone Encounter (Signed)
OK 

## 2012-07-21 NOTE — Telephone Encounter (Signed)
Patient is going to have UGI series in [place] with her GI there.  She has switched to omeprazole due to her insurance.  She will call if she needs anything

## 2012-07-21 NOTE — Telephone Encounter (Signed)
Message copied by Annett Fabian on Tue Jul 21, 2012  8:51 AM ------      Message from: Stan Head E      Created: Mon Jul 20, 2012  4:16 PM      Regarding: schedule UGI and ? change PPI       Message from her dad faxed to me - she has given permission to communicate with him            Her [place] gastroenterologist has requested an UGI Series - if she wants we can order that with a diagnosis of chest pain and heartburn            Also - need to find out what PPI is on her formulary if she knows - if she does not try lansoprazole 30 mg bid instead of the pantoprazole - could Rx #60 with 5 refills            Father is Kolbi Tofte [phone]

## 2013-07-17 ENCOUNTER — Encounter (HOSPITAL_COMMUNITY): Payer: Self-pay | Admitting: Emergency Medicine

## 2013-07-17 ENCOUNTER — Emergency Department (INDEPENDENT_AMBULATORY_CARE_PROVIDER_SITE_OTHER)
Admission: EM | Admit: 2013-07-17 | Discharge: 2013-07-17 | Disposition: A | Discharge status: Home / Self Care | Payer: BC Managed Care – PPO | Source: Home / Self Care

## 2013-07-17 DIAGNOSIS — B349 Viral infection, unspecified: Secondary | ICD-10-CM

## 2013-07-17 DIAGNOSIS — B9789 Other viral agents as the cause of diseases classified elsewhere: Secondary | ICD-10-CM

## 2013-07-17 HISTORY — DX: Gastro-esophageal reflux disease without esophagitis: K21.9

## 2013-07-17 LAB — POCT RAPID STREP A: STREPTOCOCCUS, GROUP A SCREEN (DIRECT): NEGATIVE

## 2013-07-17 NOTE — ED Notes (Signed)
C/o sore throat onset Wed. Night and Thur., then felt fatigue, cough and something in her throat.  Called out from work today. Works in a Teacher, adult education.

## 2013-07-17 NOTE — ED Provider Notes (Signed)
CSN: [id number]     Arrival date & time 07/17/13  1649 History   None    Chief Complaint  Patient presents with  . URI   (Consider location/radiation/quality/duration/timing/severity/associated sxs/prior Treatment) HPI  The patient is a 23 year old female presenting today with planes at a sore throat cough and congestion since Wednesday evening. In addition the patient reports mild generalized aches.  Denies nausea vomiting or diarrhea and  is unsure about fever. Greater than 72 hours since onset of symptoms.   Past Medical History  Diagnosis Date  . Depression   . Acute pancreatitis 01/2012    Very transient, MRCP negative, probably acute alcohol problem  . Anxiety   . OCD (obsessive compulsive disorder)   . IBS (irritable bowel syndrome)   . Simple cyst of kidney     right  . Renal angiomyolipoma     left  . GERD (gastroesophageal reflux disease)    Past Surgical History  Procedure Laterality Date  . Nasal septum surgery    . Closed reduction elbow dislocation    . Wisdom tooth extraction    . Esophagogastroduodenoscopy  05/2012    in [place]   Family History  Problem Relation Age of Onset  . Coronary artery disease Paternal Grandfather   . Hypertension Maternal Grandmother   . Breast cancer Paternal Grandmother   . Colon cancer Neg Hx    History  Substance Use Topics  . Smoking status: Never Smoker   . Smokeless tobacco: Never Used  . Alcohol Use: 0.0 oz/week     Comment: social   OB History   Grav Para Term Preterm Abortions TAB SAB Ect Mult Living                 Review of Systems  Constitutional: Negative.   HENT: Positive for congestion and sore throat.   Eyes: Negative.   Respiratory: Positive for cough. Negative for shortness of breath and wheezing.   Cardiovascular: Negative.   Gastrointestinal: Negative.   Endocrine: Negative.   Genitourinary: Negative.   Musculoskeletal: Negative.   Skin: Negative.   Allergic/Immunologic: Negative.    Neurological: Negative.   Hematological: Negative.   Psychiatric/Behavioral:       Patient takes extended release fluoxetine daily for OCD.    Allergies  Penicillins  Home Medications   Current Outpatient Rx  Name  Route  Sig  Dispense  Refill  . calcium carbonate (TUMS - DOSED IN MG ELEMENTAL CALCIUM) 500 MG chewable tablet   Oral   Chew 1 tablet (200 mg of elemental calcium total) by mouth 3 (three) times daily as needed for heartburn. heatburn         . calcium carbonate (TUMS - DOSED IN MG ELEMENTAL CALCIUM) 500 MG chewable tablet   Oral   Chew 1 tablet by mouth daily.         Marland Kitchen FLUoxetine (PROZAC WEEKLY) 90 MG DR capsule   Oral   Take 90 mg by mouth every 7 (seven) days.         Marland Kitchen guaiFENesin (MUCINEX) 600 MG 12 hr tablet   Oral   Take 600 mg by mouth 2 (two) times daily.         . Iron-FA-B Cmp-C-Biot-Probiotic (FUSION PLUS PO)   Oral   Take by mouth.         . Multiple Vitamins-Minerals (AIRBORNE PO)   Oral   Take by mouth.         . Multiple Vitamins-Minerals (CENTRUM ULTRA  WOMENS PO)   Oral   Take by mouth.         Marland Kitchen omeprazole (PRILOSEC) 20 MG capsule   Oral   Take 40 mg by mouth daily.         . Pseudoeph-Doxylamine-DM-APAP (DAYQUIL/NYQUIL COLD/FLU RELIEF PO)   Oral   Take by mouth.         . sucralfate (CARAFATE) 1 G tablet   Oral   Take 1 g by mouth 4 (four) times daily.         . Zinc Gluconate (COLD-EEZE) 13.3 MG LOZG   Mouth/Throat   Use as directed in the mouth or throat.         . pantoprazole (PROTONIX) 40 MG tablet      Take one twice a day and try and taper to one a day over the next month.   60 tablet   1    BP 121/71  Pulse 120  Temp(Src) 99.3 F (37.4 C) (Oral)  Resp 20  SpO2 100%  LMP 07/03/2013  Physical Exam  Nursing note and vitals reviewed. Constitutional: She is oriented to person, place, and time. She appears well-developed and well-nourished. No distress.  HENT:  Head: Normocephalic and  atraumatic.  Right Ear: Tympanic membrane and external ear normal.  Left Ear: Tympanic membrane and external ear normal.  Nose: Nose normal.  Mouth/Throat: Oropharynx is clear and moist. No oropharyngeal exudate.  Bilateral tympanic membranes pearly gray with light reflexes present, bony prominences easily visualized.  Eyes: Conjunctivae are normal. Pupils are equal, round, and reactive to light. Right eye exhibits no discharge. Left eye exhibits no discharge. No scleral icterus.  Neck: Normal range of motion. Neck supple.  Cardiovascular: Regular rhythm, normal heart sounds and intact distal pulses.   Patient is tachycardic, but S1-S2 still easily distinguished..  Lymphadenopathy:    She has no cervical adenopathy.  Neurological: She is alert and oriented to person, place, and time.  Skin: Skin is warm and dry. No rash noted. She is not diaphoretic. No erythema. No pallor.  Psychiatric: She has a normal mood and affect. Her behavior is normal.    ED Course  Procedures (including critical care time) Labs Review Labs Reviewed  POCT RAPID STREP A ([facility]RG CARE ONLY)   Rapid strept negative.  Imaging Review No results found.   MDM   1. Viral illness    The patient advised reagarding comfort measures such as fluids, rest, Tylenol or ibuprofen for discomfort.  The patient verbalizes understanding of plan of care.    Jacqualyn Posey, NP 07/17/13 1911  Jacqualyn Posey, NP 07/17/13 1912

## 2013-07-17 NOTE — Discharge Instructions (Signed)
Viral Infections °A virus is a type of germ. Viruses can cause: °· Minor sore throats. °· Aches and pains. °· Headaches. °· Runny nose. °· Rashes. °· Watery eyes. °· Tiredness. °· Coughs. °· Loss of appetite. °· Feeling sick to your stomach (nausea). °· Throwing up (vomiting). °· Watery poop (diarrhea). °HOME CARE  °· Only take medicines as told by your doctor. °· Drink enough water and fluids to keep your pee (urine) clear or pale yellow. Sports drinks are a good choice. °· Get plenty of rest and eat healthy. Soups and broths with crackers or rice are fine. °GET HELP RIGHT AWAY IF:  °· You have a very bad headache. °· You have shortness of breath. °· You have chest pain or neck pain. °· You have an unusual rash. °· You cannot stop throwing up. °· You have watery poop that does not stop. °· You cannot keep fluids down. °· You or your child has a temperature by mouth above 102° F (38.9° C), not controlled by medicine. °· Your baby is older than 3 months with a rectal temperature of 102° F (38.9° C) or higher. °· Your baby is 3 months old or younger with a rectal temperature of 100.4° F (38° C) or higher. °MAKE SURE YOU:  °· Understand these instructions. °· Will watch this condition. °· Will get help right away if you are not doing well or get worse. °Document Released: 05/16/2008 Document Revised: 08/26/2011 Document Reviewed: 10/09/2010 °[employer]® Patient Information ©2014 [employer]. ° °

## 2013-07-18 NOTE — ED Provider Notes (Signed)
Medical screening examination/treatment/procedure(s) were performed by non-physician practitioner and as supervising physician I was immediately available for consultation/collaboration.  Philipp Deputy, M.D.   Harden Mo, MD 07/18/13 [phone]

## 2013-07-19 LAB — CULTURE, GROUP A STREP

## 2013-09-11 ENCOUNTER — Encounter (HOSPITAL_COMMUNITY): Payer: Self-pay | Admitting: Emergency Medicine

## 2013-09-11 ENCOUNTER — Emergency Department (INDEPENDENT_AMBULATORY_CARE_PROVIDER_SITE_OTHER)
Admission: EM | Admit: 2013-09-11 | Discharge: 2013-09-11 | Disposition: A | Discharge status: Home / Self Care | Payer: BC Managed Care – PPO | Source: Home / Self Care | Attending: Emergency Medicine | Admitting: Emergency Medicine

## 2013-09-11 DIAGNOSIS — S61219A Laceration without foreign body of unspecified finger without damage to nail, initial encounter: Secondary | ICD-10-CM

## 2013-09-11 DIAGNOSIS — S61209A Unspecified open wound of unspecified finger without damage to nail, initial encounter: Secondary | ICD-10-CM

## 2013-09-11 HISTORY — DX: Anemia, unspecified: D64.9

## 2013-09-11 NOTE — ED Provider Notes (Signed)
Medical screening examination/treatment/procedure(s) were performed by a resident physician and as supervising physician I was immediately available for consultation/collaboration.  Philipp Deputy, M.D.  Harden Mo, MD 09/11/13 2156

## 2013-09-11 NOTE — Discharge Instructions (Signed)
Keep the area clean and dry. You can take the bandage off if you are at home. I expect this to heal up on its own with no difficulty. If you have increased redness, unable to move your finger or you have pus in the wound please come back.

## 2013-09-11 NOTE — ED Notes (Signed)
Cut R long finger on a knife on Wednesday.  No signs of infection. States it still hurts.  No bleeding noted.

## 2013-09-11 NOTE — ED Provider Notes (Signed)
CSN: [id number]     Arrival date & time 09/11/13  1612 History   First MD Initiated Contact with Patient 09/11/13 1726     Chief Complaint  Patient presents with  . Extremity Laceration   (Consider location/radiation/quality/duration/timing/severity/associated sxs/prior Treatment) HPI Pt is a 23 yo F presenting with R pointer finger laceration that occurred 4 days ago. Patient was at work at [employer]. Knife was hanging and her hand hit the knife. Bleeding stopped, keeping the area covered. She put antibiotic cream on the area. Still painful, but moving fingers well. No numbness or tingling. No fevers or redness.  Past Medical History  Diagnosis Date  . Depression   . Acute pancreatitis 01/2012    Very transient, MRCP negative, probably acute alcohol problem  . Anxiety   . OCD (obsessive compulsive disorder)   . IBS (irritable bowel syndrome)   . Simple cyst of kidney     right  . Renal angiomyolipoma     left  . GERD (gastroesophageal reflux disease)   . Anemia    Past Surgical History  Procedure Laterality Date  . Nasal septum surgery    . Closed reduction elbow dislocation    . Wisdom tooth extraction    . Esophagogastroduodenoscopy  05/2012    in [place]   Family History  Problem Relation Age of Onset  . Coronary artery disease Paternal Grandfather   . Hypertension Maternal Grandmother   . Breast cancer Paternal Grandmother   . Stroke Paternal Grandmother   . Cancer Paternal Grandmother   . Colon cancer Neg Hx    History  Substance Use Topics  . Smoking status: Never Smoker   . Smokeless tobacco: Never Used  . Alcohol Use: 0.0 oz/week     Comment: social   OB History   Grav Para Term Preterm Abortions TAB SAB Ect Mult Living                 Review of Systems  Constitutional: Negative for fever and chills.  HENT: Negative for congestion.   Eyes: Negative for visual disturbance.  Respiratory: Negative for cough and shortness of breath.   Cardiovascular: Negative  for chest pain and leg swelling.  Gastrointestinal: Negative for abdominal pain.  Genitourinary: Negative for dysuria.  Musculoskeletal: Negative for arthralgias and myalgias.  Skin: Positive for wound. Negative for rash.  Neurological: Negative for headaches.    Allergies  Penicillins  Home Medications   Current Outpatient Rx  Name  Route  Sig  Dispense  Refill  . Al Hyd-Mg Tr-Alg Ac-Sod Bicarb (GAVISCON-2 PO)   Oral   Take by mouth every morning.         Marland Kitchen FLUoxetine (PROZAC WEEKLY) 90 MG DR capsule   Oral   Take 90 mg by mouth every 7 (seven) days.         . Iron-FA-B Cmp-C-Biot-Probiotic (FUSION PLUS PO)   Oral   Take by mouth.         . Multiple Vitamins-Minerals (CENTRUM ULTRA WOMENS PO)   Oral   Take by mouth.         . norethindrone-ethinyl estradiol (JUNEL FE,GILDESS FE,LOESTRIN FE) 1-20 MG-MCG tablet   Oral   Take 1 tablet by mouth daily.         Marland Kitchen omeprazole (PRILOSEC) 20 MG capsule   Oral   Take 40 mg by mouth daily.         . Probiotic Product (ALIGN PO)   Oral   Take  by mouth.         . sucralfate (CARAFATE) 1 G tablet   Oral   Take 1 g by mouth 4 (four) times daily.         . calcium carbonate (TUMS - DOSED IN MG ELEMENTAL CALCIUM) 500 MG chewable tablet   Oral   Chew 1 tablet (200 mg of elemental calcium total) by mouth 3 (three) times daily as needed for heartburn. heatburn         . calcium carbonate (TUMS - DOSED IN MG ELEMENTAL CALCIUM) 500 MG chewable tablet   Oral   Chew 1 tablet by mouth daily.         Marland Kitchen guaiFENesin (MUCINEX) 600 MG 12 hr tablet   Oral   Take 600 mg by mouth 2 (two) times daily.         . Multiple Vitamins-Minerals (AIRBORNE PO)   Oral   Take by mouth.         . pantoprazole (PROTONIX) 40 MG tablet      Take one twice a day and try and taper to one a day over the next month.   60 tablet   1   . Pseudoeph-Doxylamine-DM-APAP (DAYQUIL/NYQUIL COLD/FLU RELIEF PO)   Oral   Take by mouth.          . Zinc Gluconate (COLD-EEZE) 13.3 MG LOZG   Mouth/Throat   Use as directed in the mouth or throat.          BP 119/78  Pulse 84  Temp(Src) 98.3 F (36.8 C) (Oral)  Resp 18  SpO2 100%  LMP 09/02/2013 Physical Exam  Constitutional: She is oriented to person, place, and time. She appears well-developed and well-nourished. No distress.  HENT:  Head: Normocephalic and atraumatic.  Neck: Normal range of motion. Neck supple.  Cardiovascular: Normal rate and regular rhythm.   Pulmonary/Chest: Effort normal. No respiratory distress.  Abdominal: Soft. There is no tenderness.  Musculoskeletal: Normal range of motion. She exhibits no edema and no tenderness.  Lymphadenopathy:    She has no cervical adenopathy.  Neurological: She is alert and oriented to person, place, and time.  Skin: Skin is warm and dry.  1cm laceration to right middle finger next to nail bed. Approximates well. No bleeding or erythema. No discharge from wound  Psychiatric: She has a normal mood and affect.    ED Course  Procedures (including critical care time) Labs Review Labs Reviewed - No data to display Imaging Review No results found.  MDM   1. Finger laceration    Laceration without signs of infection. Expect it to heal without complications - Given reassurance - Keep clean and dry. Cover while at work - F/u if she develops fever, redness, pus or any other concerns.  Montez Morita, MD 09/11/13 2044

## 2014-04-26 ENCOUNTER — Ambulatory Visit
Admission: RE | Admit: 2014-04-26 | Discharge: 2014-04-26 | Disposition: A | Discharge status: Home / Self Care | Payer: BC Managed Care – PPO | Source: Ambulatory Visit | Attending: Family | Admitting: Family

## 2014-04-26 ENCOUNTER — Other Ambulatory Visit: Payer: Self-pay | Admitting: Family

## 2014-04-26 DIAGNOSIS — M25532 Pain in left wrist: Secondary | ICD-10-CM

## 2014-09-06 ENCOUNTER — Emergency Department (INDEPENDENT_AMBULATORY_CARE_PROVIDER_SITE_OTHER): Payer: BLUE CROSS/BLUE SHIELD

## 2014-09-06 ENCOUNTER — Emergency Department (HOSPITAL_COMMUNITY)
Admission: EM | Admit: 2014-09-06 | Discharge: 2014-09-06 | Disposition: A | Discharge status: Home / Self Care | Payer: BLUE CROSS/BLUE SHIELD | Source: Home / Self Care | Attending: Family Medicine | Admitting: Family Medicine

## 2014-09-06 ENCOUNTER — Encounter (HOSPITAL_COMMUNITY): Payer: Self-pay | Admitting: Emergency Medicine

## 2014-09-06 DIAGNOSIS — R69 Illness, unspecified: Principal | ICD-10-CM

## 2014-09-06 DIAGNOSIS — J111 Influenza due to unidentified influenza virus with other respiratory manifestations: Secondary | ICD-10-CM | POA: Diagnosis not present

## 2014-09-06 MED ORDER — ALBUTEROL SULFATE (2.5 MG/3ML) 0.083% IN NEBU
5.0000 mg | INHALATION_SOLUTION | Freq: Once | RESPIRATORY_TRACT | Status: AC
  Administered 2014-09-06: 5 mg via RESPIRATORY_TRACT

## 2014-09-06 MED ORDER — ALBUTEROL SULFATE (2.5 MG/3ML) 0.083% IN NEBU
INHALATION_SOLUTION | RESPIRATORY_TRACT | Status: AC
  Filled 2014-09-06: qty 6

## 2014-09-06 MED ORDER — ONDANSETRON 4 MG PO TBDP: 4.0000 mg | ORAL_TABLET | Freq: Four times a day (QID) | ORAL | Status: AC | PRN

## 2014-09-06 MED ORDER — PREDNISONE 50 MG PO TABS: 50.0000 mg | ORAL_TABLET | Freq: Every day | ORAL | Status: AC

## 2014-09-06 MED ORDER — IPRATROPIUM BROMIDE 0.02 % IN SOLN
0.5000 mg | Freq: Once | RESPIRATORY_TRACT | Status: AC
  Administered 2014-09-06: 0.5 mg via RESPIRATORY_TRACT

## 2014-09-06 MED ORDER — IPRATROPIUM BROMIDE 0.02 % IN SOLN
RESPIRATORY_TRACT | Status: AC
  Filled 2014-09-06: qty 2.5

## 2014-09-06 MED ORDER — ALBUTEROL SULFATE HFA 108 (90 BASE) MCG/ACT IN AERS: 2.0000 | INHALATION_SPRAY | RESPIRATORY_TRACT | Status: AC | PRN

## 2014-09-06 MED ORDER — GUAIFENESIN-CODEINE 100-10 MG/5ML PO SOLN: 10.0000 mL | ORAL | Status: AC | PRN

## 2014-09-06 NOTE — Discharge Instructions (Signed)

## 2014-09-06 NOTE — ED Notes (Signed)
Patient c/o cold sx including fever, chills, nasal drainage and cough and chest congestion onset x 4 days. Patient reports she has taken various OTC medications with no relief. Patient is in NAD.

## 2014-09-06 NOTE — ED Provider Notes (Signed)
CSN: [id number]     Arrival date & time 09/06/14  1327 History   First MD Initiated Contact with Patient 09/06/14 1546     Chief Complaint  Patient presents with  . URI   (Consider location/radiation/quality/duration/timing/severity/associated sxs/prior Treatment) HPI          24 year old female presents for evaluation of cough, congestion, wheezing, shortness of breath. This initially started 4 days ago with cough, high fever, body aches. Her symptoms have improved significantly but she is still having a bad cough with the shortness of breath and wheezing. She has chest pain with coughing as well. She denies any other symptoms. Over-the-counter medications are helping slightly  Past Medical History  Diagnosis Date  . Depression   . Acute pancreatitis 01/2012    Very transient, MRCP negative, probably acute alcohol problem  . Anxiety   . OCD (obsessive compulsive disorder)   . IBS (irritable bowel syndrome)   . Simple cyst of kidney     right  . Renal angiomyolipoma     left  . GERD (gastroesophageal reflux disease)   . Anemia    Past Surgical History  Procedure Laterality Date  . Nasal septum surgery    . Closed reduction elbow dislocation    . Wisdom tooth extraction    . Esophagogastroduodenoscopy  05/2012    in [place]   Family History  Problem Relation Age of Onset  . Coronary artery disease Paternal Grandfather   . Hypertension Maternal Grandmother   . Breast cancer Paternal Grandmother   . Stroke Paternal Grandmother   . Cancer Paternal Grandmother   . Colon cancer Neg Hx    History  Substance Use Topics  . Smoking status: Never Smoker   . Smokeless tobacco: Never Used  . Alcohol Use: 0.0 oz/week     Comment: social   OB History    No data available     Review of Systems  Constitutional: Positive for fever and chills.  HENT: Positive for congestion, rhinorrhea and sore throat. Negative for ear pain.   Respiratory: Positive for cough, chest tightness, shortness  of breath and wheezing.   Cardiovascular: Positive for chest pain (with coughing only).  Gastrointestinal: Positive for nausea. Negative for vomiting, abdominal pain and diarrhea.  All other systems reviewed and are negative.   Allergies  Penicillins  Home Medications   Prior to Admission medications   Medication Sig Start Date End Date Taking? Authorizing Provider  Al Hyd-Mg Tr-Alg Ac-Sod Bicarb (GAVISCON-2 PO) Take by mouth every morning.    Historical Provider, MD  albuterol (PROVENTIL HFA;VENTOLIN HFA) 108 (90 BASE) MCG/ACT inhaler Inhale 2 puffs into the lungs every 4 (four) hours as needed for wheezing. 09/06/14   Liam Graham, PA-C  calcium carbonate (TUMS - DOSED IN MG ELEMENTAL CALCIUM) 500 MG chewable tablet Chew 1 tablet (200 mg of elemental calcium total) by mouth 3 (three) times daily as needed for heartburn. heatburn 02/12/12   Eugenie Filler, MD  calcium carbonate (TUMS - DOSED IN MG ELEMENTAL CALCIUM) 500 MG chewable tablet Chew 1 tablet by mouth daily.    Historical Provider, MD  FLUoxetine (PROZAC WEEKLY) 90 MG DR capsule Take 90 mg by mouth every 7 (seven) days.    Historical Provider, MD  guaiFENesin (MUCINEX) 600 MG 12 hr tablet Take 600 mg by mouth 2 (two) times daily.    Historical Provider, MD  guaiFENesin-codeine 100-10 MG/5ML syrup Take 10 mLs by mouth every 4 (four) hours as needed for  cough. 09/06/14   Liam Graham, PA-C  Iron-FA-B Cmp-C-Biot-Probiotic (FUSION PLUS PO) Take by mouth.    Historical Provider, MD  Multiple Vitamins-Minerals (AIRBORNE PO) Take by mouth.    Historical Provider, MD  Multiple Vitamins-Minerals (CENTRUM ULTRA WOMENS PO) Take by mouth.    Historical Provider, MD  norethindrone-ethinyl estradiol (JUNEL FE,GILDESS FE,LOESTRIN FE) 1-20 MG-MCG tablet Take 1 tablet by mouth daily.    Historical Provider, MD  omeprazole (PRILOSEC) 20 MG capsule Take 40 mg by mouth daily.    Historical Provider, MD  ondansetron (ZOFRAN-ODT) 4 MG  disintegrating tablet Take 1 tablet (4 mg total) by mouth every 6 (six) hours as needed for nausea. PRN for nausea or vomiting 09/06/14   Liam Graham, PA-C  pantoprazole (PROTONIX) 40 MG tablet Take one twice a day and try and taper to one a day over the next month. 04/02/12   Gatha Mayer, MD  predniSONE (DELTASONE) 50 MG tablet Take 1 tablet (50 mg total) by mouth daily with breakfast. 09/06/14   Liam Graham, PA-C  Probiotic Product (ALIGN PO) Take by mouth.    Historical Provider, MD  Pseudoeph-Doxylamine-DM-APAP (DAYQUIL/NYQUIL COLD/FLU RELIEF PO) Take by mouth.    Historical Provider, MD  sucralfate (CARAFATE) 1 G tablet Take 1 g by mouth 4 (four) times daily.    Historical Provider, MD  Zinc Gluconate (COLD-EEZE) 13.3 MG LOZG Use as directed in the mouth or throat.    Historical Provider, MD   BP 126/78 mmHg  Pulse 68  Temp(Src) 99.4 F (37.4 C) (Oral)  Resp 18  SpO2 100%  LMP 08/28/2014 Physical Exam  Constitutional: She is oriented to person, place, and time. Vital signs are normal. She appears well-developed and well-nourished. No distress.  HENT:  Head: Normocephalic and atraumatic.  Right Ear: External ear normal.  Left Ear: External ear normal.  Nose: Nose normal.  Mouth/Throat: Oropharynx is clear and moist. No oropharyngeal exudate.  Eyes: Conjunctivae are normal.  Neck: Normal range of motion. Neck supple.  Cardiovascular: Normal rate, regular rhythm and normal heart sounds.   Pulmonary/Chest: Effort normal. No respiratory distress. She has wheezes. She has no rhonchi. She has no rales.  Mild inspiratory stridor and upper lung fields bilaterally  Lymphadenopathy:    She has no cervical adenopathy.  Neurological: She is alert and oriented to person, place, and time. She has normal strength. Coordination normal.  Skin: Skin is warm and dry. No rash noted. She is not diaphoretic.  Psychiatric: She has a normal mood and affect. Judgment normal.  Nursing note and  vitals reviewed.   ED Course  Procedures (including critical care time) Labs Review Labs Reviewed - No data to display  Imaging Review Dg Chest 2 View  09/06/2014   CLINICAL DATA:  Fever, chills, cough, congestion  EXAM: CHEST  2 VIEW  COMPARISON:  None.  FINDINGS: Cardiomediastinal silhouette is unremarkable. No acute infiltrate or pleural effusion. No pulmonary edema. Bony thorax is unremarkable.  IMPRESSION: No active cardiopulmonary disease.   Electronically Signed   By: Lahoma Crocker M.D.   On: 09/06/2014 16:01     MDM   1. Influenza-like illness    She felt somewhat better after the breathing treatment. Her x-rays negative. Treat with prednisone and albuterol to help with the wheezing, cough suppressant. Zofran for nausea. I believe she most likely has had a flu and is getting over it. Follow-up again if any worsening   Meds ordered this encounter  Medications  .  albuterol (PROVENTIL) (2.5 MG/3ML) 0.083% nebulizer solution 5 mg    Sig:   . ipratropium (ATROVENT) nebulizer solution 0.5 mg    Sig:   . predniSONE (DELTASONE) 50 MG tablet    Sig: Take 1 tablet (50 mg total) by mouth daily with breakfast.    Dispense:  5 tablet    Refill:  0    Order Specific Question:  Supervising Provider    Answer:  Billy Fischer [phone]  . albuterol (PROVENTIL HFA;VENTOLIN HFA) 108 (90 BASE) MCG/ACT inhaler    Sig: Inhale 2 puffs into the lungs every 4 (four) hours as needed for wheezing.    Dispense:  1 Inhaler    Refill:  0    Order Specific Question:  Supervising Provider    Answer:  Ihor Gully D [id number]  . guaiFENesin-codeine 100-10 MG/5ML syrup    Sig: Take 10 mLs by mouth every 4 (four) hours as needed for cough.    Dispense:  120 mL    Refill:  0    Order Specific Question:  Supervising Provider    Answer:  Billy Fischer [phone]  . ondansetron (ZOFRAN-ODT) 4 MG disintegrating tablet    Sig: Take 1 tablet (4 mg total) by mouth every 6 (six) hours as needed for nausea. PRN for  nausea or vomiting    Dispense:  12 tablet    Refill:  0    Order Specific Question:  Supervising Provider    Answer:  Ihor Gully D [place], PA-C 09/06/14 1622

## 2016-04-06 IMAGING — CR DG WRIST COMPLETE 3+V*L*
4 series · 4 of 4 positions shown · non-contrast
Comparison: None.

CLINICAL DATA: Pain in left navicular region particularly with
wrist flexion. No recent trauma.

EXAM:
LEFT WRIST - COMPLETE 3+ VIEW

[x wrist pa left]
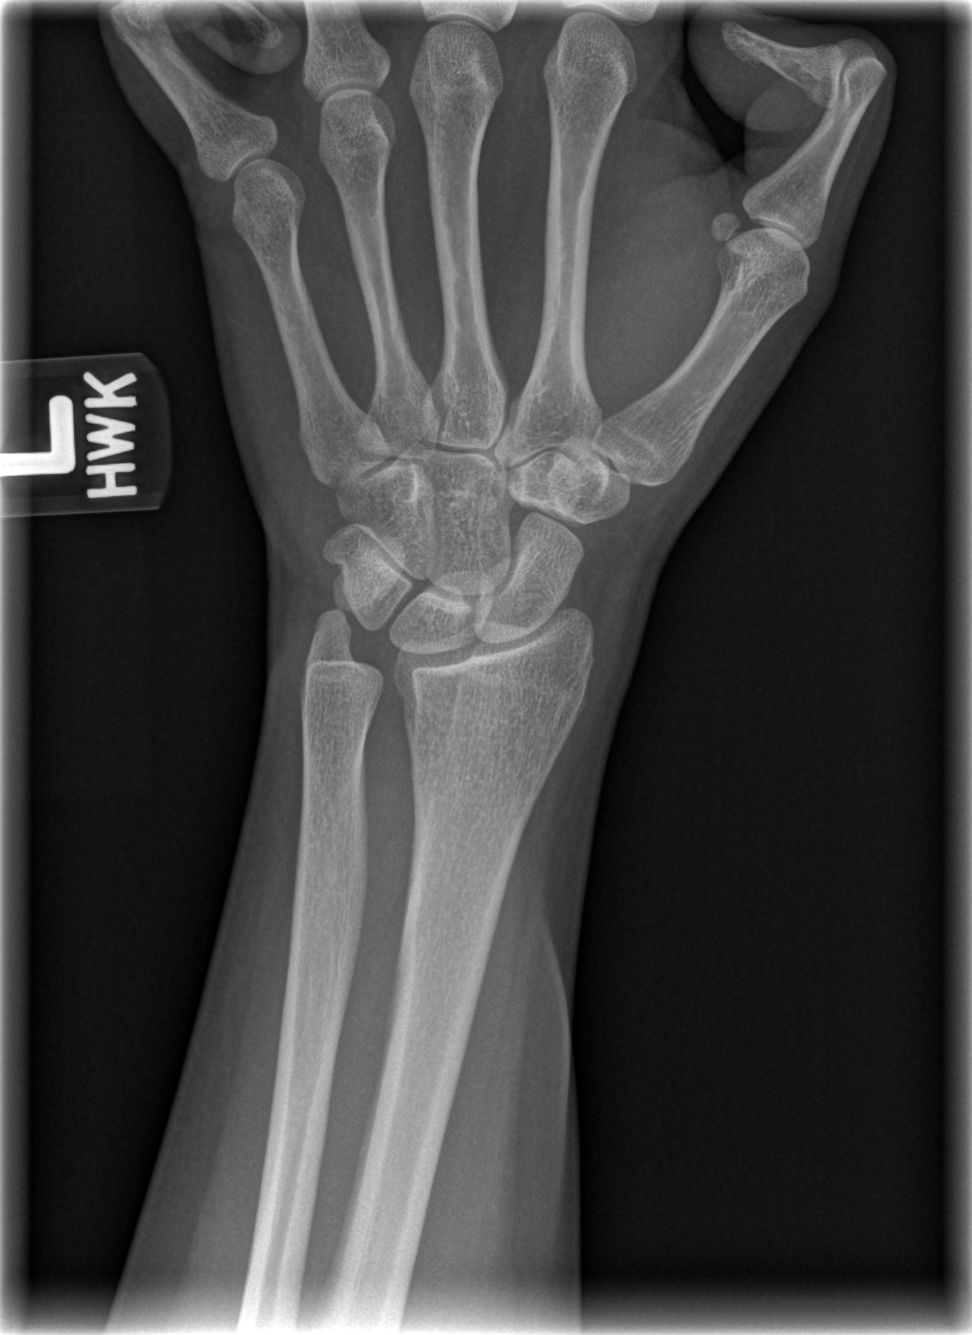

[x wrist obl left]
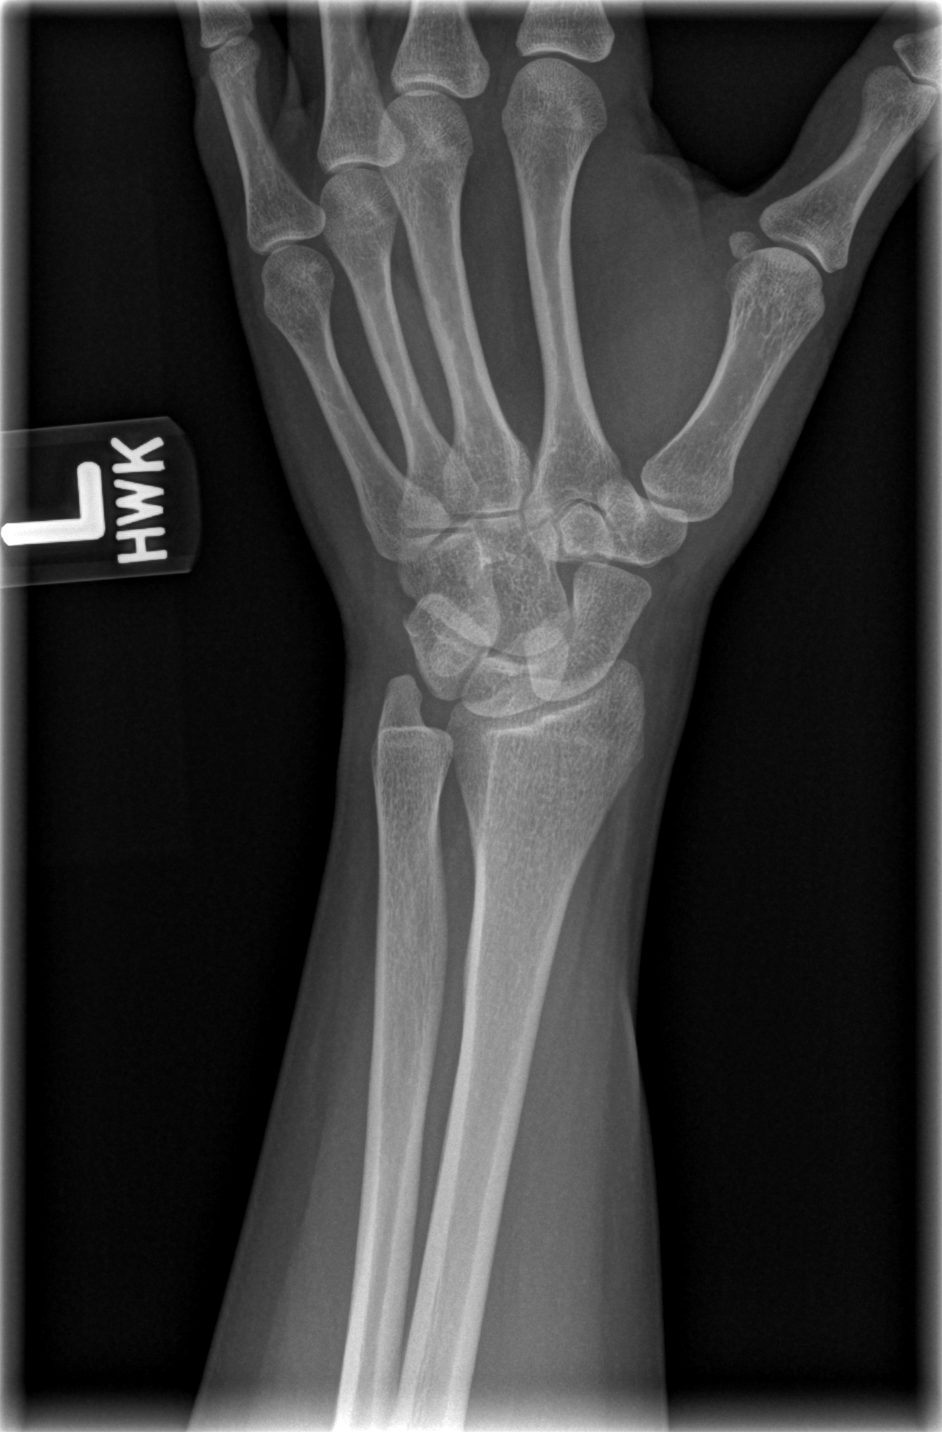

[x wrist lat left]
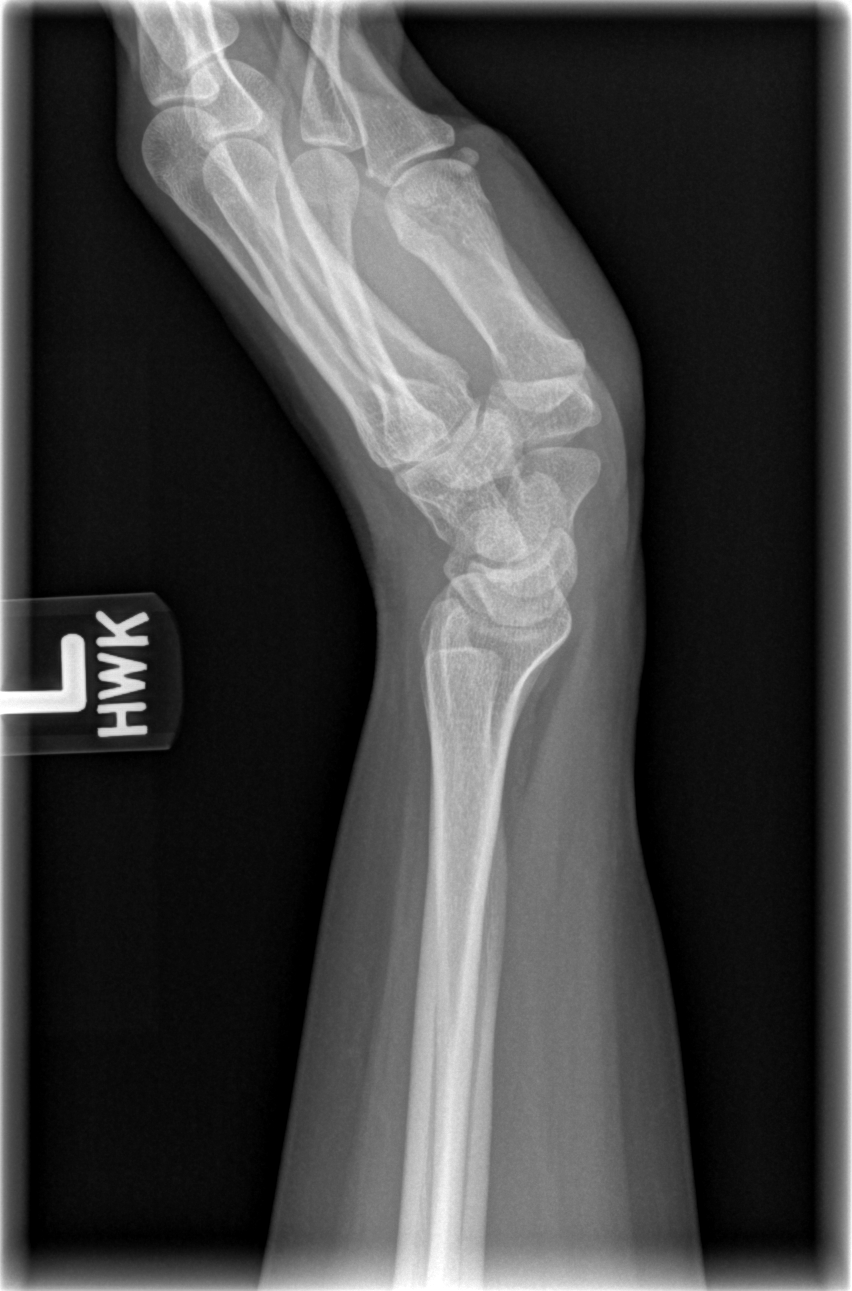

[x navicular]
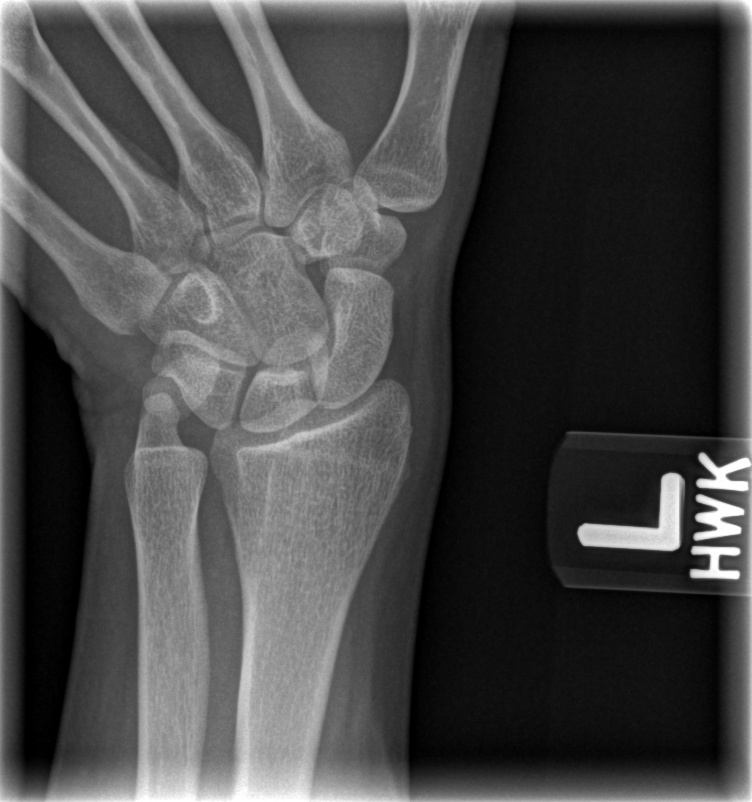

[4 of 4 positions shown; findings below may reference images not displayed]

FINDINGS: Elongated ulnar styloid could cause ulnar triquetral low abutment,
but this would not be expected cause radial sided pain. The distal
radial ulnar joint appears somewhat wide.

The scaphoid and remainder of the carpus appear unremarkable. The
lateral projection is obtained with the wrist hyperextended, but
otherwise appears unremarkable.
IMPRESSION: 1. A specific cause for the patient's radially sided wrist pain is
not seen, but potential contributors to wrist dysfunction include in
the elongated ulnar styloid process (which could lead to
ulnatriquetral abutment) as well as a widened appearance of the
distal radial ulnar joint which could be a manifestation of prior
injury. If pain persists despite conservative therapy, MRI or MR
arthrogram may be warranted for further characterization.
